# Patient Record
Sex: Female | Born: 1970 | Race: White | Hispanic: No | Marital: Married | Smoking: Former smoker
Health system: Southern US, Community
[De-identification: ages and names within clinical notes are randomized; demographics above are authoritative.]

## PROBLEM LIST (undated history)

## (undated) DIAGNOSIS — N841 Polyp of cervix uteri: Secondary | ICD-10-CM

## (undated) DIAGNOSIS — G43909 Migraine, unspecified, not intractable, without status migrainosus: Secondary | ICD-10-CM

## (undated) HISTORY — DX: Polyp of cervix uteri: N84.1

## (undated) HISTORY — PX: TONSILLECTOMY AND ADENOIDECTOMY: SUR1326

## (undated) HISTORY — DX: Migraine, unspecified, not intractable, without status migrainosus: G43.909

---

## 2005-02-03 ENCOUNTER — Inpatient Hospital Stay (HOSPITAL_COMMUNITY): Admission: AD | Admit: 2005-02-03 | Discharge: 2005-02-03 | Payer: Self-pay | Admitting: *Deleted

## 2005-03-21 ENCOUNTER — Other Ambulatory Visit: Admission: RE | Admit: 2005-03-21 | Discharge: 2005-03-21 | Payer: Self-pay | Admitting: Obstetrics and Gynecology

## 2005-05-04 ENCOUNTER — Ambulatory Visit (HOSPITAL_COMMUNITY): Admission: RE | Admit: 2005-05-04 | Discharge: 2005-05-04 | Payer: Self-pay | Admitting: Obstetrics and Gynecology

## 2005-05-07 ENCOUNTER — Ambulatory Visit (HOSPITAL_COMMUNITY): Admission: RE | Admit: 2005-05-07 | Discharge: 2005-05-07 | Payer: Self-pay | Admitting: Obstetrics and Gynecology

## 2005-05-07 ENCOUNTER — Encounter (INDEPENDENT_AMBULATORY_CARE_PROVIDER_SITE_OTHER): Payer: Self-pay | Admitting: *Deleted

## 2005-05-07 HISTORY — PX: LAPAROSCOPIC OVARIAN CYSTECTOMY: SHX6248

## 2006-03-03 DIAGNOSIS — N841 Polyp of cervix uteri: Secondary | ICD-10-CM

## 2006-03-03 HISTORY — DX: Polyp of cervix uteri: N84.1

## 2006-03-22 ENCOUNTER — Other Ambulatory Visit: Admission: RE | Admit: 2006-03-22 | Discharge: 2006-03-22 | Payer: Self-pay | Admitting: Obstetrics & Gynecology

## 2006-07-05 ENCOUNTER — Ambulatory Visit (HOSPITAL_COMMUNITY): Admission: RE | Admit: 2006-07-05 | Discharge: 2006-07-05 | Payer: Self-pay | Admitting: Obstetrics & Gynecology

## 2007-02-10 ENCOUNTER — Ambulatory Visit: Payer: Self-pay | Admitting: Family Medicine

## 2007-02-10 DIAGNOSIS — G43809 Other migraine, not intractable, without status migrainosus: Secondary | ICD-10-CM | POA: Insufficient documentation

## 2007-02-20 ENCOUNTER — Ambulatory Visit: Payer: Self-pay | Admitting: Family Medicine

## 2007-02-20 DIAGNOSIS — J069 Acute upper respiratory infection, unspecified: Secondary | ICD-10-CM | POA: Insufficient documentation

## 2007-04-21 ENCOUNTER — Other Ambulatory Visit: Admission: RE | Admit: 2007-04-21 | Discharge: 2007-04-21 | Payer: Self-pay | Admitting: Obstetrics & Gynecology

## 2007-04-24 ENCOUNTER — Encounter: Payer: Self-pay | Admitting: Family Medicine

## 2007-05-07 ENCOUNTER — Encounter: Payer: Self-pay | Admitting: Family Medicine

## 2008-04-20 ENCOUNTER — Ambulatory Visit: Payer: Self-pay | Admitting: Family Medicine

## 2008-04-22 ENCOUNTER — Other Ambulatory Visit: Admission: RE | Admit: 2008-04-22 | Discharge: 2008-04-22 | Payer: Self-pay | Admitting: Obstetrics & Gynecology

## 2008-06-11 ENCOUNTER — Encounter: Admission: RE | Admit: 2008-06-11 | Discharge: 2008-06-11 | Payer: Self-pay | Admitting: Orthopedic Surgery

## 2009-01-06 ENCOUNTER — Encounter: Admission: RE | Admit: 2009-01-06 | Discharge: 2009-01-06 | Payer: Self-pay | Admitting: Neurosurgery

## 2009-01-15 ENCOUNTER — Inpatient Hospital Stay (HOSPITAL_COMMUNITY): Admission: AD | Admit: 2009-01-15 | Discharge: 2009-01-15 | Payer: Self-pay | Admitting: Obstetrics & Gynecology

## 2009-02-11 ENCOUNTER — Encounter: Admission: RE | Admit: 2009-02-11 | Discharge: 2009-02-11 | Payer: Self-pay | Admitting: Neurosurgery

## 2009-02-14 ENCOUNTER — Encounter: Admission: RE | Admit: 2009-02-14 | Discharge: 2009-02-14 | Payer: Self-pay | Admitting: Neurosurgery

## 2009-03-07 ENCOUNTER — Encounter: Admission: RE | Admit: 2009-03-07 | Discharge: 2009-03-07 | Payer: Self-pay | Admitting: Neurosurgery

## 2009-10-24 ENCOUNTER — Inpatient Hospital Stay (HOSPITAL_COMMUNITY): Admission: AD | Admit: 2009-10-24 | Discharge: 2009-10-24 | Payer: Self-pay | Admitting: Obstetrics and Gynecology

## 2009-12-12 ENCOUNTER — Inpatient Hospital Stay (HOSPITAL_COMMUNITY): Admission: AD | Admit: 2009-12-12 | Discharge: 2009-12-15 | Payer: Self-pay | Admitting: Obstetrics & Gynecology

## 2010-08-24 ENCOUNTER — Ambulatory Visit: Payer: Self-pay | Admitting: Family Medicine

## 2010-08-29 ENCOUNTER — Telehealth (INDEPENDENT_AMBULATORY_CARE_PROVIDER_SITE_OTHER): Payer: Self-pay

## 2010-11-30 ENCOUNTER — Ambulatory Visit
Admission: RE | Admit: 2010-11-30 | Discharge: 2010-11-30 | Payer: Self-pay | Source: Home / Self Care | Admitting: Family Medicine

## 2010-11-30 DIAGNOSIS — R059 Cough, unspecified: Secondary | ICD-10-CM | POA: Insufficient documentation

## 2010-11-30 DIAGNOSIS — J209 Acute bronchitis, unspecified: Secondary | ICD-10-CM | POA: Insufficient documentation

## 2010-11-30 DIAGNOSIS — R05 Cough: Secondary | ICD-10-CM

## 2010-12-02 ENCOUNTER — Telehealth (INDEPENDENT_AMBULATORY_CARE_PROVIDER_SITE_OTHER): Payer: Self-pay | Admitting: *Deleted

## 2010-12-24 ENCOUNTER — Encounter: Payer: Self-pay | Admitting: Neurosurgery

## 2010-12-24 ENCOUNTER — Encounter: Payer: Self-pay | Admitting: Obstetrics & Gynecology

## 2011-01-02 NOTE — Progress Notes (Signed)
  Phone Note Outgoing Call   Call placed by: Areta Haber CMA,  August 29, 2010 8:01 PM Call placed to: Patient Summary of Call: Courtesy call message  left on cell voice mail. Initial call taken by: Areta Haber CMA,  August 29, 2010 8:02 PM

## 2011-01-02 NOTE — Assessment & Plan Note (Signed)
Summary: PAIN IN ELBOW AND ARM/TJ Rm 5   Vital Signs:  Patient Profile:   40 Years Old Female CC:      right elbow pain x 2 months, increasing Height:     71 inches Weight:      145 pounds O2 Sat:      100 % O2 treatment:    Room Air Temp:     98.5 degrees F oral Pulse rate:   75 / minute Resp:     14 per minute BP sitting:   119 / 78  (left arm) Cuff size:   regular  Pt. in pain?   yes    Location:   right elbow    Type:       aching  Vitals Entered By: Lajean Saver RN (August 24, 2010 2:12 PM)                   Updated Prior Medication List: MULTIVITAMINS  CAPS (MULTIPLE VITAMIN) take one capsule by mouth once a day  Current Allergies (reviewed today): BIAXINHistory of Present Illness Chief Complaint: right elbow pain x 2 months, increasing History of Present Illness:  Subjective:  Patient complains of persistent pain in her right elbow for about 2 months.  No recent injury but she does recall getting her right arm caught in her son's crib about 7 months ago.  She now has persistent pain in her lateral elbow, worse when lifting her 17 month old son.  REVIEW OF SYSTEMS Constitutional Symptoms      Denies fever, chills, night sweats, weight loss, weight gain, and fatigue.  Eyes       Denies change in vision, eye pain, eye discharge, glasses, contact lenses, and eye surgery. Ear/Nose/Throat/Mouth       Denies hearing loss/aids, change in hearing, ear pain, ear discharge, dizziness, frequent runny nose, frequent nose bleeds, sinus problems, sore throat, hoarseness, and tooth pain or bleeding.  Respiratory       Denies dry cough, productive cough, wheezing, shortness of breath, asthma, bronchitis, and emphysema/COPD.  Cardiovascular       Denies murmurs, chest pain, and tires easily with exhertion.    Gastrointestinal       Denies stomach pain, nausea/vomiting, diarrhea, constipation, blood in bowel movements, and indigestion. Genitourniary       Denies painful  urination, kidney stones, and loss of urinary control. Neurological       Denies paralysis, seizures, and fainting/blackouts. Musculoskeletal       Complains of joint pain and joint stiffness.      Denies muscle pain, decreased range of motion, redness, swelling, muscle weakness, and gout.      Comments: right elbow Skin       Denies bruising, unusual mles/lumps or sores, and hair/skin or nail changes.  Psych       Denies mood changes, temper/anger issues, anxiety/stress, speech problems, depression, and sleep problems. Other Comments: Patient c/o right elbow pain intermittent x 2 months, increasing in the past week   Past History:  Past Medical History: Reviewed history from 04/20/2008 and no changes required. LBP 2008 - [facility]  Past Surgical History: Reviewed history from 02/10/2007 and no changes required. GYN surgery-Ovarian cyst removed 6/06 Tonsillectomy 9/04  Family History: Reviewed history from 02/10/2007 and no changes required. 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   Current Smoker 1/2 PPD Alcohol use-no Regular exercise-yes Drug use-no Drug Use:  no   Objective:  Appearance:  Patient appears healthy, stated age, and in no acute distress  Right elbow:  Full range of motion.  No swelling or deformity.  Distinct tenderness over the lateral epicondyle, worse with resisted dorsiflexion of the wrist.  Distal neurovascular intact  RIGHT ELBOW - COMPLETE 3+ VIEW   Comparison: None.   Findings: No acute fracture is seen.  Alignment is normal.  No joint effusion is seen.   IMPRESSION: Negative right elbow.  Assessment New Problems: LATERAL EPICONDYLITIS, RIGHT (ICD-726.32) ELBOW PAIN, RIGHT (ICD-719.42)   Plan New Medications/Changes: TRAMADOL HCL 50 MG TABS (TRAMADOL HCL) One or two tabs by mouth hs as needed for pain  #15 x 0, 08/24/2010, Donna Christen MD NAPROXEN 500 MG TABS (NAPROXEN) One by mouth  two times a day pc  #20 x 1, 08/24/2010, Donna Christen MD  New Orders: T-DG Elbow Complete*R* [73080] New Patient Level III [99203] Tennis Elbow Support [L3701] Planning Comments:   Dispensed tennis elbow strap.  Begin ice packs and Naproxen.  Tramadol at bedtime.  Begin stretching exercises ([facility] information and instruction patient handout given)  If not improved  2 to 3 weeks, recommend evaluation by [facility].   The patient and/or caregiver has been counseled thoroughly with regard to medications prescribed including dosage, schedule, interactions, rationale for use, and possible side effects and they verbalize understanding.  Diagnoses and expected course of recovery discussed and will return if not improved as expected or if the condition worsens. Patient and/or caregiver verbalized understanding.  Prescriptions: TRAMADOL HCL 50 MG TABS (TRAMADOL HCL) One or two tabs by mouth hs as needed for pain  #15 x 0   Entered and Authorized by:   Donna Christen MD   Signed by:   Donna Christen MD on 08/24/2010   Method used:   Print then Give to Patient   RxID:   [id number] NAPROXEN 500 MG TABS (NAPROXEN) One by mouth two times a day pc  #20 x 1   Entered and Authorized by:   Donna Christen MD   Signed by:   Donna Christen MD on 08/24/2010   Method used:   Print then Give to Patient   RxID:   [id number]   Orders Added: 1)  T-DG Elbow Complete*R* [73080] 2)  New Patient Level III [84696] 3)  Tennis Elbow Support [L3701]

## 2011-01-04 NOTE — Assessment & Plan Note (Signed)
Summary: COUGH,CONGESTION,SORE THROAT/TJ Room 5   Vital Signs:  Patient Profile:   40 Years Old Female CC:      Cough, congestion, tired x 1 month got better came back 5 days ago Height:     71 inches Weight:      142 pounds O2 Sat:      99 % O2 treatment:    Room Air Temp:     98.2 degrees F oral Pulse rate:   112 / minute Pulse rhythm:   regular Resp:     16 per minute BP sitting:   106 / 76  (left arm) Cuff size:   regular  Vitals Entered By: Emilio Math (November 30, 2010 6:26 PM)                  Current Allergies (reviewed today): BIAXINHistory of Present Illness Chief Complaint: Cough, congestion, tired x 1 month got better came back 5 days ago History of Present Illness:  Subjective: Patient complains of URI symptoms that started one week ago with sinus congestion followed by a non-productive cough.  She also had a mild sore throat now resolved.  She states that she had a milder respiratory infection about one month ago that resolved after one week with Levaquin.   No pleuritic pain No wheezing + nasal congestion +  post-nasal drainage No sinus pain/pressure No itchy/red eyes No earache No hemoptysis + SOB with activity + fever/chills No nausea No vomiting No abdominal pain No diarrhea No skin rashes + fatigue No myalgias No headache Used OTC meds without relief   Current Meds MULTIVITAMINS  CAPS (MULTIPLE VITAMIN) take one capsule by mouth once a day IBUPROFEN 800 MG TABS (IBUPROFEN)  AZITHROMYCIN 250 MG TABS (AZITHROMYCIN) Two tabs by mouth on day 1, then 1 tab daily on days 2 through 5 BENZONATATE 200 MG CAPS (BENZONATATE) One by mouth hs as needed cough  REVIEW OF SYSTEMS Constitutional Symptoms       Complains of fatigue.     Denies fever, chills, night sweats, weight loss, and weight gain.  Eyes       Denies change in vision, eye pain, eye discharge, glasses, contact lenses, and eye surgery. Ear/Nose/Throat/Mouth       Complains of  frequent runny nose, sinus problems, and hoarseness.      Denies hearing loss/aids, change in hearing, ear pain, ear discharge, dizziness, frequent nose bleeds, sore throat, and tooth pain or bleeding.  Respiratory       Complains of dry cough.      Denies productive cough, wheezing, shortness of breath, asthma, bronchitis, and emphysema/COPD.  Cardiovascular       Denies murmurs, chest pain, and tires easily with exhertion.    Gastrointestinal       Denies stomach pain, nausea/vomiting, diarrhea, constipation, blood in bowel movements, and indigestion. Genitourniary       Denies painful urination, kidney stones, and loss of urinary control. Neurological       Complains of headaches.      Denies paralysis, seizures, and fainting/blackouts. Musculoskeletal       Denies muscle pain, joint pain, joint stiffness, decreased range of motion, redness, swelling, muscle weakness, and gout.  Skin       Denies bruising, unusual mles/lumps or sores, and hair/skin or nail changes.  Psych       Denies mood changes, temper/anger issues, anxiety/stress, speech problems, depression, and sleep problems.  Past History:  Past Medical History: Reviewed history from 04/20/2008 and  no changes required. LBP 2008 - [facility]  Past Surgical History: Reviewed history from 02/10/2007 and no changes required. GYN surgery-Ovarian cyst removed 6/06 Tonsillectomy 9/04  Family History: Reviewed history from 02/10/2007 and no changes required. Uncles with DM Mother, Healthy Father, Healthy  Social History: .  Assoc. degree. single.   Current Smoker 1/2 PPD, 16 yrs Alcohol use-no Regular exercise-yes Drug use-no   Objective:  Appearance:  Patient appears healthy, stated age, and in no acute distress  Eyes:  Pupils are equal, round, and reactive to light and accomdation.  Extraocular movement is intact.  Conjunctivae are not inflamed.  Ears:  Canals normal.  Tympanic membranes normal.     Nose:  Normal septum.  Normal turbinates, mildly congested.   No sinus tenderness present.  Pharynx:  Normal, moist mucous membranes  Neck:  Supple.  Slightly tender shotty posterior nodes are palpated bilaterally.  Lungs:   Bibasilar wheezes anterior and posterior.  Rhonchi posteriorly.  Few rales left base.  Breath sounds are equal. Heart:  Regular rate and rhythm without murmurs, rubs, or gallops.  Abdomen:  Nontender without masses or hepatosplenomegaly.  Bowel sounds are present.  No CVA or flank tenderness.  Extremities:  No edema. Skin:  No rash Chest X-ray: No evidence of acute cardiopulmonary disease.   Assessment New Problems: ACUTE BRONCHITIS (ICD-466.0) COUGH (ICD-786.2)  ? PERTUSSIS  Plan New Medications/Changes: BENZONATATE 200 MG CAPS (BENZONATATE) One by mouth hs as needed cough  #12 x 0, 11/30/2010, Donna Christen MD AZITHROMYCIN 250 MG TABS (AZITHROMYCIN) Two tabs by mouth on day 1, then 1 tab daily on days 2 through 5  #6 tabs x 0, 11/30/2010, Donna Christen MD  New Orders: T-Chest x-ray, 2 views [71020] Pulse Oximetry (single measurment) [94760] Est. Patient Level IV [16109] Planning Comments:   Begin Z-pack, expectorant/decongestant, cough suppressant at bedtime.  Increase fluid intake Followup with PCP if not improving 10 to 14 days. Recommend Tdap and flu shot when well.   The patient and/or caregiver has been counseled thoroughly with regard to medications prescribed including dosage, schedule, interactions, rationale for use, and possible side effects and they verbalize understanding.  Diagnoses and expected course of recovery discussed and will return if not improved as expected or if the condition worsens. Patient and/or caregiver verbalized understanding.  Prescriptions: BENZONATATE 200 MG CAPS (BENZONATATE) One by mouth hs as needed cough  #12 x 0   Entered and Authorized by:   Donna Christen MD   Signed by:   Donna Christen MD on 11/30/2010   Method  used:   Print then Give to Patient   RxID:   [id number] AZITHROMYCIN 250 MG TABS (AZITHROMYCIN) Two tabs by mouth on day 1, then 1 tab daily on days 2 through 5  #6 tabs x 0   Entered and Authorized by:   Donna Christen MD   Signed by:   Donna Christen MD on 11/30/2010   Method used:   Print then Give to Patient   RxID:   [id number]   Patient Instructions: 1)  May use Mucinex D (guaifenesin with decongestant) or plain Mucinex twice daily for cough and congestion. 2)  Increase fluid intake, rest. 3)  May use Afrin nasal spray (or generic oxymetazoline) twice daily for about 5 days.  Also recommend using saline nasal spray several times daily and/or saline nasal irrigation. 4)  Followup with family doctor if not improving 10 to 14 days.  Orders Added: 1)  T-Chest x-ray, 2 views [71020]  2)  Pulse Oximetry (single measurment) [94760] 3)  Est. Patient Level IV [16109]

## 2011-01-04 NOTE — Progress Notes (Signed)
  Phone Note Outgoing Call   Call placed by: Clemens Catholic LPN,  December 02, 2010 5:37 PM Call placed to: Patient Summary of Call: call back: called to F/U with pt. left message to call back if any questions or concerns. Initial call taken by: Clemens Catholic LPN,  December 02, 2010 5:38 PM

## 2011-01-29 ENCOUNTER — Other Ambulatory Visit: Payer: Self-pay | Admitting: Nurse Practitioner

## 2011-01-29 DIAGNOSIS — N83201 Unspecified ovarian cyst, right side: Secondary | ICD-10-CM

## 2011-01-30 ENCOUNTER — Ambulatory Visit (HOSPITAL_COMMUNITY)
Admission: RE | Admit: 2011-01-30 | Discharge: 2011-01-30 | Disposition: A | Discharge status: Home / Self Care | Payer: Managed Care, Other (non HMO) | Source: Ambulatory Visit | Attending: Nurse Practitioner | Admitting: Nurse Practitioner

## 2011-01-30 DIAGNOSIS — N83209 Unspecified ovarian cyst, unspecified side: Secondary | ICD-10-CM | POA: Insufficient documentation

## 2011-01-30 DIAGNOSIS — N83201 Unspecified ovarian cyst, right side: Secondary | ICD-10-CM

## 2011-01-30 DIAGNOSIS — N949 Unspecified condition associated with female genital organs and menstrual cycle: Secondary | ICD-10-CM | POA: Insufficient documentation

## 2011-02-18 LAB — CBC
Hemoglobin: 13.1 g/dL (ref 12.0–15.0)
MCV: 96.7 fL (ref 78.0–100.0)
Platelets: 146 10*3/uL — ABNORMAL LOW (ref 150–400)
Platelets: 203 10*3/uL (ref 150–400)
RBC: 2.61 MIL/uL — ABNORMAL LOW (ref 3.87–5.11)
RDW: 13.1 % (ref 11.5–15.5)
RDW: 13.5 % (ref 11.5–15.5)

## 2011-03-07 LAB — URINALYSIS, ROUTINE W REFLEX MICROSCOPIC
Glucose, UA: NEGATIVE mg/dL
Protein, ur: NEGATIVE mg/dL

## 2011-03-20 LAB — COMPREHENSIVE METABOLIC PANEL
AST: 21 U/L (ref 0–37)
Albumin: 4 g/dL (ref 3.5–5.2)
Alkaline Phosphatase: 54 U/L (ref 39–117)
BUN: 4 mg/dL — ABNORMAL LOW (ref 6–23)
CO2: 28 mEq/L (ref 19–32)
Chloride: 107 mEq/L (ref 96–112)
Creatinine, Ser: 0.66 mg/dL (ref 0.4–1.2)
GFR calc non Af Amer: >60 mL/min (ref >60)
Potassium: 3.3 mEq/L — ABNORMAL LOW (ref 3.5–5.1)
Total Bilirubin: 0.5 mg/dL (ref 0.3–1.2)

## 2011-03-20 LAB — URINALYSIS, ROUTINE W REFLEX MICROSCOPIC
Bilirubin Urine: NEGATIVE
Hgb urine dipstick: NEGATIVE
Nitrite: NEGATIVE
Protein, ur: NEGATIVE mg/dL
Specific Gravity, Urine: 1.02 (ref 1.005–1.030)
Urobilinogen, UA: 0.2 mg/dL (ref 0.0–1.0)

## 2011-03-20 LAB — AMYLASE: Amylase: 52 U/L (ref 27–131)

## 2011-03-20 LAB — POCT PREGNANCY, URINE: Preg Test, Ur: NEGATIVE

## 2011-03-20 LAB — CBC
Hemoglobin: 13.9 g/dL (ref 12.0–15.0)
MCHC: 34.2 g/dL (ref 30.0–36.0)

## 2011-04-03 HISTORY — PX: DIAGNOSTIC LAPAROSCOPY: SUR761

## 2011-04-06 ENCOUNTER — Other Ambulatory Visit: Payer: Self-pay | Admitting: Obstetrics & Gynecology

## 2011-04-06 ENCOUNTER — Encounter (HOSPITAL_COMMUNITY): Payer: Managed Care, Other (non HMO)

## 2011-04-06 LAB — CBC
MCH: 30.8 pg (ref 26.0–34.0)
MCV: 89.8 fL (ref 78.0–100.0)
Platelets: 226 10*3/uL (ref 150–400)
RBC: 4.71 MIL/uL (ref 3.87–5.11)
RDW: 12.2 % (ref 11.5–15.5)

## 2011-04-06 LAB — SURGICAL PCR SCREEN: Staphylococcus aureus: NEGATIVE

## 2011-04-16 ENCOUNTER — Ambulatory Visit (HOSPITAL_COMMUNITY)
Admission: RE | Admit: 2011-04-16 | Discharge: 2011-04-16 | Disposition: A | Discharge status: Home / Self Care | Payer: Managed Care, Other (non HMO) | Source: Ambulatory Visit | Attending: Obstetrics & Gynecology | Admitting: Obstetrics & Gynecology

## 2011-04-16 ENCOUNTER — Other Ambulatory Visit: Payer: Self-pay | Admitting: Obstetrics & Gynecology

## 2011-04-16 DIAGNOSIS — Z01812 Encounter for preprocedural laboratory examination: Secondary | ICD-10-CM | POA: Insufficient documentation

## 2011-04-16 DIAGNOSIS — N801 Endometriosis of ovary: Secondary | ICD-10-CM | POA: Insufficient documentation

## 2011-04-16 DIAGNOSIS — N80209 Endometriosis of unspecified fallopian tube, unspecified depth: Secondary | ICD-10-CM | POA: Insufficient documentation

## 2011-04-16 DIAGNOSIS — Z01818 Encounter for other preprocedural examination: Secondary | ICD-10-CM | POA: Insufficient documentation

## 2011-04-16 DIAGNOSIS — R1031 Right lower quadrant pain: Secondary | ICD-10-CM | POA: Insufficient documentation

## 2011-04-16 DIAGNOSIS — N80109 Endometriosis of ovary, unspecified side, unspecified depth: Secondary | ICD-10-CM | POA: Insufficient documentation

## 2011-04-16 DIAGNOSIS — N802 Endometriosis of fallopian tube: Secondary | ICD-10-CM | POA: Insufficient documentation

## 2011-04-20 NOTE — Op Note (Signed)
NAME:  [name] [name], [name] [name]             ACCOUNT NO.:  [id number]   MEDICAL RECORD NO.:  [id number]          PATIENT TYPE:  AMB   LOCATION:  SDC                           FACILITY:  [facility]   PHYSICIAN:  Cynthia P. Romine, M.D.DATE OF BIRTH:  25-Jul-1971   DATE OF PROCEDURE:  05/07/2005  DATE OF DISCHARGE:                                 OPERATIVE REPORT   PREOPERATIVE DIAGNOSIS:  8 cm clear right ovarian cyst.   POSTOPERATIVE DIAGNOSIS:  8 cm clear right ovarian cyst.  Pathology pending.   PROCEDURE:  Laparoscopic right ovarian cystectomy.   SURGEON:  Cynthia P. Romine, M.D.   ASSISTANT:  Gretta Cool, M.D.   ANESTHESIA:  General endotracheal.   ESTIMATED BLOOD LOSS:  Minimal.   COMPLICATIONS:  None.   DESCRIPTION OF PROCEDURE:  The patient was taken to the operating room and  after the induction of adequate general endotracheal anesthesia was placed  in the dorsal lithotomy position and prepped and draped in the usual sterile  fashion.  A Hulka uterine manipulator was placed.  The bladder was drained  with a red rubber catheter.  Attention was next turned to the umbilicus.  A  small vertical infraumbilical incision was made and a Veress needle was  inserted into the peritoneal space.  Proper placement was tested by noting  negative aspirate through the Veress needle with free flow of saline, again  with a negative aspirate, and then by noting the response of a drop of  saline placed at the hub of the Veress needle to negative pressure as the  abdominal wall was elevated.  Pneumoperitoneum was created with the  automatic insufflator to a pressure of 3 liters of CO2.  Disposable 10/11 mm  trocar was then inserted into the peritoneal space and proper placement  noted with the laparoscope.  Next, two suprapubic incisions were made to the  right and left of the midline and 5 mm trocars were inserted under direct  visualization.  The pelvis was inspected.  The uterus and tubes  appeared  normal.  The anterior and posterior cul-de-sacs were normal.  The left ovary  was also normal.  The right ovary had fallen into posterior cul-de-sac and  was freely mobile, but when elevated contained a smooth, approximately 8 cm  cyst.  Photographic documentation was taken.  Through one of the 5 mm ports  a needle aspirator was inserted into the apex of the ovarian cyst and 120 mL  of turbid fluid was removed from the cyst to decompress it.  The apex of the  cyst wall was then grasped with pickups and opened with scissors. And the  lining of the cyst was grasped with a pickup and sharp and blunt dissection  was used to tease the cyst wall away from the overlying ovary.  Bipolar  cautery was used to control any small vessels that looked like they might  bleed.  The hook scissors were used to trim the cyst wall away from the base  of the ovary where necessary and the cyst wall was freed off of the ovary.  It was  then pulled out through the 10 mm umbilical port.  The ovary was  inspected.  A Nezhat was used to irrigate the pelvis.  There was no bleeding  noted from the bed of the right ovary where the cyst wall had been removed  and in fact the opening in the ovary closed over nicely into an anatomic  position.  The pelvis was irrigated.  Again the ovary was inspected and felt  to be hemostatic.  The instruments were removed from the abdomen.  The  pneumoperitoneum was allowed to escape before the abdominal port was  removed.  The incisions were closed subcuticularly with 4-0 Vicryl.  The  instruments were removed from the vagina and the procedure was terminated.  The patient tolerated it well and went in satisfactory condition to  postanesthesia recovery.      CPR/MEDQ  D:  05/07/2005  T:  05/07/2005  Job:  [id number]   cc:   Gretta Cool, M.D.  [address] [place]  [place] [address]  Fax: [phone]

## 2011-04-24 NOTE — Op Note (Signed)
[name], [name]            ACCOUNT NO.:  [id number]  MEDICAL RECORD NO.:  [id number]           PATIENT TYPE:  LOCATION:                                 FACILITY:  PHYSICIAN:  M. Leda Quail, MD  DATE OF BIRTH:  28-May-1971  DATE OF PROCEDURE: DATE OF DISCHARGE:                              OPERATIVE REPORT   PREOPERATIVE DIAGNOSES: 32. 40 year old G2, P1, A1 married white female with chronic right     lower quadrant pain. 2. 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 3. Smoker.  POSTOPERATIVE DIAGNOSES: 63. 40 year old G2, P1, A1 married white female with chronic right     lower quadrant pain. 2. 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 3. Smoker. 4. Possible endometriosis on the right tube and right ovary.  PROCEDURE:  Diagnostic laparoscopy with biopsy of left fallopian tube and cautery of possible endometriosis on right ovary and right fallopian tube.  SURGEON:  M. Leda Quail, MD  ASSISTANT:  Edwena Felty. Romine, MD  ANESTHESIA:  General endotracheal, Dr. Rodman Pickle oversaw the case.  FINDINGS:  Small purplish area on the right ovary, a cyst of Morgagni on the right fallopian tube, a small beefy area of tissue on the right fallopian tube, 2 areas on the ovary and tube were consistent with possible endometriosis.  SPECIMENS:  Right tubal tissue biopsy sent to Pathology.  ESTIMATED BLOOD LOSS:  Minimal.  FLUIDS:  1000 mL of LR.  URINE OUTPUT:  300 mL of clear urine in the Foley catheter.  COMPLICATIONS:  None.  INDICATIONS:  [name] [name] is a very nice 40 year old G2, P27, A2 married white female who has had approximately 4 months of intermittent right lower quadrant pain.  She has sought evaluation in the ER at least twice because of this and has had ultrasounds as well as variety of blood work and CT scan done.  All of her evaluation thus far has been  negative. She came to see me in the beginning of April.  Because of the amount of evaluation that she has had, we talked about possible etiology of right lower quadrant pain.  It does have an intermittent cyclical nature to it and does somewhat seem to be related to her menstrual cycles.  She has had some small cysts noted on ultrasound and CT, but these have been insignificant looking and seem unlikely to be the source of her pain in my opinion.  Because of this, I have recommended we evaluate for endometriosis, possible interstitial cystitis, and possible adhesions of the right fallopian tube.  Other possible causes of the pelvic pain have been discussed with her as well.  We have discussed evaluation steps and I did discuss with her diagnostic laparoscopy for evaluation of endometriosis with treatment as well as for adhesive disease.  Risks and benefits have been explained to the patient.  This is all documented in her office chart.  She has consented and presents today for the procedure.  PROCEDURE:  The patient was taken to the operating room.  She was  placed in supine position.  General endotracheal anesthesia was administered by the anesthesia staff without difficulty.  Legs were placed in the low lithotomy position in [place] stirrups.  Running IV was in her left hand which was placed on the arm boards.  The right arm was tucked.  Once she was positioned, SCDs were on her lower extremities bilaterally.  The patient was prepped at the skin level with ChloraPrep and the vaginal mucosa and perineum with a Betadine.  After 3 minutes when the ChloraPrep was dried, the patient was draped in a normal standard fashion.  Legs were lifted to the high lithotomy position.  A bivalve speculum was placed in the vagina.  The anterior lip of the cervix was grasped with single-tooth tenaculum.  A Hulka clamp was passed through the cervical canal and attached to the cervix as the means to  manipulate the uterus in the procedure.  The tenaculum was removed.  The bivalve speculum was removed.  A Foley catheter was inserted in the bladder to straight drain.  Legs were positioned in the low lithotomy position. Attention was turned to the abdomen.  Marcaine 0.25% was used to anesthetize the umbilicus.  She has had a previous laparoscopy and the same incision was used for the surgery.  Using a #11 blade, the incision was reentered and re-incised.  Subcutaneous fat and tissue was dissected with hemostats.  The abdomen was elevated.  Veress needle was passed through the abdominal wall layers without difficulty.  A syringe of normal saline was obtained and aspiration was performed without any blood or fluid being noted.  Fluid passed easily into the syringe and another aspiration was performed.  No blood, fluid, or saline was brought back into the tube.  Fluid dripped easily into the tube and then pneumoperitoneum was achieved under low flow CO2 gas.  Once 2-1/2 liters from of the abdomen, the Veress needle was removed.  A 10-mm laparoscope with the OptiVu non-bladed trocar and port were obtained.  With a twisting motion, these were passed through the abdominal wall layers. The abdominal wall layers were visualized as they were traversed.  Once the device was passed into the pelvic cavity and passed through the peritoneum, the trocar was removed.  Then, the laparoscope was used to visualize the pelvis.  The CO2 gas was placed on the high flow.  The patient was placed in some Trendelenburg and with manipulation of the uterus through the Hulka clamp, the anterior and posterior cul-de-sacs, uterosacral ligaments, right and left pelvic sidewalls, upper abdomen including stomach, falciform ligament, liver edge, gallbladder, and appendix were all noted and noted to be normal.  Because I wanted to see the front and the back side of the ovary and the peritoneum behind the ovaries, the  abdominal wall layer on the right was transilluminated. Marcaine 0.25% was used to anesthetize the skin.  An #11 blade was used to make a 5-mm skin incision.  A non-bladed trocar and port were placed through the right lower quadrant out difficulty.  Then, a grasper was used to lift the ovaries bilaterally.  The left ovary and tube were completely normal.  The right tube had a small cyst of Morgagni on it. Then, there was a small purplish area noted on the right ovary and a small beefy area of tissue noted on the right fallopian tube.  These were possible areas of endometriosis.  Behind the ovary, it was completely normal.  There was no adhesions noted.  Of note, the  ovary was sitting in old pocket of tissue that was created by the ureter on the right, but the ovary was freely mobile, so I was not sure that this was anything other than just finding at the time of surgery.  A biopsy forceps was used to biopsy the beefy tissue of the fallopian tube. Monopolar cautery was then obtained.  The patient was grounded.  Using endoscopic scissors, the area on the fallopian tube was cauterized superficially.  Normal saline was used to irrigate the pelvis at this point, no bleeding was noted.  The procedure was ended.  The instruments were removed under direct visualization of the laparoscope and the right lower quadrant port was removed under direct visualization of laparoscope.  Then, the pneumoperitoneum was released.  The port was removed from the midline after the CRNA gave the patient several deep breaths and attempted again to get any CO2 gas out of the abdomen.  The fascia was closed at the umbilicus with figure-of-eight suture of 0 Vicryl.  Skin was then closed with subcuticular stitch of 4-0 Vicryl. Skin was cleansed.  Dermabond was applied.  The instruments from the vagina were removed and there was no bleeding noted from the cervix.  The patient was taken out of low lithotomy position to  the supine position. She was awakened from anesthesia and extubated without difficulty. Sponge, lap, needle, and instrument counts were correct x2.  Pathology specimen was the right fallopian tube biopsy.     Lum Keas, MD     MSM/MEDQ  D:  04/16/2011  T:  04/16/2011  Job:  [id number]  Electronically Signed by Paul Half MD on 04/24/2011 10:00:12 AM

## 2013-05-11 ENCOUNTER — Encounter: Payer: Self-pay | Admitting: Obstetrics & Gynecology

## 2013-05-11 ENCOUNTER — Telehealth: Payer: Self-pay | Admitting: *Deleted

## 2013-05-11 NOTE — Telephone Encounter (Signed)
Need to move appointment later in day due to unscheduled surgery conflict. LMTCB.

## 2013-05-11 NOTE — Telephone Encounter (Signed)
Appointment moved down to 4pm on same day due to surgery schedule.

## 2013-05-12 ENCOUNTER — Ambulatory Visit (INDEPENDENT_AMBULATORY_CARE_PROVIDER_SITE_OTHER): Payer: Managed Care, Other (non HMO) | Admitting: Obstetrics & Gynecology

## 2013-05-12 ENCOUNTER — Encounter: Payer: Self-pay | Admitting: Obstetrics & Gynecology

## 2013-05-12 ENCOUNTER — Ambulatory Visit: Payer: Self-pay | Admitting: Obstetrics & Gynecology

## 2013-05-12 VITALS — BP 100/60 | HR 92 | Resp 16 | Ht 69.75 in | Wt 139.8 lb

## 2013-05-12 DIAGNOSIS — Z01419 Encounter for gynecological examination (general) (routine) without abnormal findings: Secondary | ICD-10-CM

## 2013-05-12 DIAGNOSIS — Z0000 Encounter for general adult medical examination without abnormal findings: Secondary | ICD-10-CM

## 2013-05-12 LAB — POCT URINALYSIS DIPSTICK
Bilirubin, UA: NEGATIVE
Blood, UA: NEGATIVE
Ketones, UA: NEGATIVE
Protein, UA: NEGATIVE
pH, UA: 5

## 2013-05-12 MED ORDER — TRAMADOL HCL 50 MG PO TABS: 50.0000 mg | ORAL_TABLET | Freq: Four times a day (QID) | ORAL | Status: DC | PRN

## 2013-05-12 NOTE — Patient Instructions (Signed)

## 2013-05-12 NOTE — Progress Notes (Signed)
42 y.o. G2P1 MarriedCaucasianF here for annual exam.  Cycles are regular.  Still having pain midcycle.  Does have cramping.  No change really over the last few years.  She has undergone an evaluation for back pain to see if this was contributing.  Some months do hurt more than others.    D/W pt starting MMG.    Patient's last menstrual period was 04/26/2013.          Sexually active: yes  The current method of family planning is condoms most of the time.    Exercising: no  not regularly Smoker:  yes  Health Maintenance: Pap:  04/10/12 WNL (ASCUS/negative HR HPV 2012) History of abnormal Pap:  no MMG:  none Colonoscopy:  none BMD:   none TDaP:  04/10/12 Screening Labs: none today, Hb today: 14.3, Urine today: negative   reports that she has been smoking.  She has never used smokeless tobacco. She reports that she does not drink alcohol or use illicit drugs.  Past Medical History  Diagnosis Date  . Migraine     without aura  . Endocervical polyp 4/07    benign    Past Surgical History  Procedure Laterality Date  . Tonsillectomy and adenoidectomy    . Laparoscopic ovarian cystectomy  05/07/05    right (7cm)/secondary to mucinous cystadenoma  . Diagnostic laparoscopy  5/12    secondary to pain/negative    Current Outpatient Prescriptions  Medication Sig Dispense Refill  . Calcium-Magnesium-Vitamin D (CALCIUM 500 PO) Take by mouth daily.      . cetirizine (ZYRTEC) 10 MG tablet Take 10 mg by mouth daily.      Marland Kitchen ibuprofen (ADVIL,MOTRIN) 200 MG tablet Take 200 mg by mouth every 6 (six) hours as needed for pain.       No current facility-administered medications for this visit.    Family History  Problem Relation Age of Onset  . Hypertension Father   . Thyroid disease Mother     thyroidectomy  . Osteoporosis Mother   . Osteoporosis Maternal Grandmother   . Breast cancer Paternal Aunt 42  . Ovarian cancer Paternal Aunt     ROS:  Pertinent items are noted in HPI.  Otherwise,  a comprehensive ROS was negative.  Exam:   BP 100/60  Pulse 92  Resp 16  Ht 5' 9.75" (1.772 m)  Wt 139 lb 12.8 oz (63.413 kg)  BMI 20.2 kg/m2  LMP 04/26/2013  Weight change: +3lbs  Height: 5' 9.75" (177.2 cm)  Ht Readings from Last 3 Encounters:  05/12/13 5' 9.75" (1.772 m)  02/10/07 5\' 11"  (1.803 m)    General appearance: alert, cooperative and appears stated age Head: Normocephalic, without obvious abnormality, atraumatic Neck: no adenopathy, supple, symmetrical, trachea midline and thyroid normal to inspection and palpation Lungs: clear to auscultation bilaterally Breasts: normal appearance, no masses or tenderness Heart: regular rate and rhythm Abdomen: soft, non-tender; bowel sounds normal; no masses,  no organomegaly Extremities: extremities normal, atraumatic, no cyanosis or edema Skin: Skin color, texture, turgor normal. No rashes or lesions Lymph nodes: Cervical, supraclavicular, and axillary nodes normal. No abnormal inguinal nodes palpated Neurologic: Grossly normal   Pelvic: External genitalia:  no lesions              Urethra:  normal appearing urethra with no masses, tenderness or lesions              Bartholins and Skenes: normal  Vagina: normal appearing vagina with normal color and discharge, no lesions              Cervix: no lesions              Pap taken: no Bimanual Exam:  Uterus:  normal size, contour, position, consistency, mobility, non-tender              Adnexa: normal adnexa and no mass, fullness, tenderness               Rectovaginal: Confirms               Anus:  normal sphincter tone, no lesions  A:  Well Woman with normal exam H/O RLQ pain, mid cycle, negative laparoscopy Migraines  P:   Mammogram recommended.  D/W pt 3D MMG. Plan fasting labs next year pap smear nl last year.  Neg HR HPV 2012. Tramadol rx to pharmacy return annually or prn  An After Visit Summary was printed and given to the patient.

## 2013-05-13 LAB — COMPREHENSIVE METABOLIC PANEL
AST: 16 U/L (ref 0–37)
Albumin: 4.7 g/dL (ref 3.5–5.2)
BUN: 8 mg/dL (ref 6–23)
CO2: 28 mEq/L (ref 19–32)
Calcium: 10.1 mg/dL (ref 8.4–10.5)
Chloride: 105 mEq/L (ref 96–112)
Creat: 0.74 mg/dL (ref 0.50–1.10)
Glucose, Bld: 99 mg/dL (ref 70–99)
Potassium: 3.9 mEq/L (ref 3.5–5.3)

## 2013-05-13 LAB — LIPID PANEL
HDL: 43 mg/dL (ref >39)
LDL Cholesterol: 111 mg/dL — ABNORMAL HIGH (ref 0–99)
Triglycerides: 89 mg/dL (ref <150)

## 2013-05-13 LAB — TSH: TSH: 1.282 u[IU]/mL (ref 0.350–4.500)

## 2013-05-18 ENCOUNTER — Telehealth: Payer: Self-pay

## 2013-05-18 NOTE — Telephone Encounter (Signed)
6/16 lmtcb//kn 

## 2013-05-18 NOTE — Telephone Encounter (Signed)
Returning call.

## 2013-06-01 NOTE — Telephone Encounter (Signed)
6/30 lmtcb//kn 

## 2013-06-08 NOTE — Telephone Encounter (Signed)
Patient notified of all results. 

## 2013-09-04 ENCOUNTER — Telehealth: Payer: Self-pay | Admitting: Obstetrics & Gynecology

## 2013-09-04 NOTE — Telephone Encounter (Signed)
[facility] [place] [facility] requesting refill on tramadol for patient. Their number is [phone].

## 2013-09-04 NOTE — Telephone Encounter (Signed)
Please advise #30/4 rf's was sent through [facility] 05/12/13

## 2013-09-09 MED ORDER — TRAMADOL HCL 50 MG PO TABS: 50.0000 mg | ORAL_TABLET | Freq: Four times a day (QID) | ORAL | Status: DC | PRN

## 2013-09-09 NOTE — Telephone Encounter (Signed)
Actually this needs to be called in.  #30/0 refills.  50mg  1-2 every 6 hrs prn.

## 2013-09-09 NOTE — Telephone Encounter (Signed)
Dr. Hyacinth Meeker  Have you had a chance to look at this refill request.

## 2013-09-09 NOTE — Telephone Encounter (Signed)
Please call pt and assess headaches.  She is using Tramadol for them and has used #30/4 refills since June.  That is a LOT.  One refill sent to pharmacy but if she is using that much, needs headache assessment.  I can place referral order.

## 2013-09-10 NOTE — Telephone Encounter (Signed)
Spoke with patient about refills for Tramadol #30 w/4 refills since  05/2013 to assess headaches, patient says She's using the Tramadol for Ovulation pain and if she's having a headache at that time then this Is a bonus. I explained to her that Dr. Hyacinth Meeker was concerned if you were taking this for headaches she would Need a referral for headache assessment. Pt assured me that it was not headaches mainly Ovulation pain. Is this ok?

## 2013-09-10 NOTE — Telephone Encounter (Signed)
[phone]) for Tramadol 50mg  #30 w/ 0refills 1-2 every 6hrs prn. Pt was notified.

## 2013-11-10 ENCOUNTER — Other Ambulatory Visit: Payer: Self-pay | Admitting: Obstetrics & Gynecology

## 2013-11-10 NOTE — Telephone Encounter (Signed)
Pt.asking for refill on Tramadol 50mg , Rx given 09/09/13 #30 x 0 refills. Please advise

## 2013-11-11 NOTE — Telephone Encounter (Signed)
Rx faxed to [facility]  Tramadol 50 mg #30 x 1 refill.

## 2014-01-21 ENCOUNTER — Other Ambulatory Visit: Payer: Self-pay | Admitting: Obstetrics & Gynecology

## 2014-01-21 NOTE — Telephone Encounter (Signed)
Last AEX 05/12/2013 Last refill 11/10/2013 #30/1 refill No future appt's.   Please advise.

## 2014-02-26 ENCOUNTER — Telehealth: Payer: Self-pay | Admitting: Obstetrics & Gynecology

## 2014-02-26 NOTE — Telephone Encounter (Signed)
Pt says she is having pelvic pain and some bloating. Would like an appointment with Dr Hyacinth MeekerMiller.

## 2014-02-26 NOTE — Telephone Encounter (Signed)
Spoke with patient. Patient states that she is having pelvic pain that has been going on for four years and that Dr.Miller is aware. Pain has gotten worse in the past six months. Feeling a sense of fullness and bloating with constipation over the past two weeks. States "I have had this pain for a while and I have had it checked and nothing has been wrong. I just want to come in to make sure everything is still okay." Patient requesting office visit with Dr.Miller for next week. Appointment made for Monday 3/30 at 8:30. Patient verbalizes understanding and is agreeable to time and date. Will call back with any further questions or concerns.  Routing to provider for final review. Patient agreeable to disposition. Will close encounter

## 2014-03-01 ENCOUNTER — Ambulatory Visit (INDEPENDENT_AMBULATORY_CARE_PROVIDER_SITE_OTHER): Payer: Managed Care, Other (non HMO) | Admitting: Obstetrics & Gynecology

## 2014-03-01 VITALS — BP 108/68 | HR 64 | Resp 16 | Ht 69.75 in | Wt 138.6 lb

## 2014-03-01 DIAGNOSIS — K5900 Constipation, unspecified: Secondary | ICD-10-CM

## 2014-03-01 DIAGNOSIS — K5909 Other constipation: Secondary | ICD-10-CM

## 2014-03-01 DIAGNOSIS — R1011 Right upper quadrant pain: Secondary | ICD-10-CM

## 2014-03-01 DIAGNOSIS — N9489 Other specified conditions associated with female genital organs and menstrual cycle: Secondary | ICD-10-CM

## 2014-03-01 LAB — HEPATIC FUNCTION PANEL
ALBUMIN: 4.6 g/dL (ref 3.5–5.2)
ALT: 15 U/L (ref 0–35)
AST: 17 U/L (ref 0–37)
Alkaline Phosphatase: 41 U/L (ref 39–117)
BILIRUBIN TOTAL: 0.5 mg/dL (ref 0.2–1.2)
Bilirubin, Direct: 0.1 mg/dL (ref 0.0–0.3)
Indirect Bilirubin: 0.4 mg/dL (ref 0.2–1.2)
TOTAL PROTEIN: 6.9 g/dL (ref 6.0–8.3)

## 2014-03-01 MED ORDER — TRAMADOL HCL 50 MG PO TABS: ORAL_TABLET | ORAL | Status: DC

## 2014-03-01 MED ORDER — FLUOXETINE HCL 10 MG PO TABS: 10.0000 mg | ORAL_TABLET | Freq: Every day | ORAL | Status: DC

## 2014-03-01 NOTE — Progress Notes (Signed)
[facility] [address] 03/04/14 at 0830. Agreeable to time/date/location.

## 2014-03-01 NOTE — Progress Notes (Signed)
Subjective:     Patient ID: [name] [name], female   DOB: [birth year], 43 y.o.   MRN: [id number]  HPI 43 yo G2P1 here for discussion of pelvic pain and bloating.  Feels like she has a "water balloon" in her pelvis that she seems to feel more when she moves, sits, exercises.  This seems to have worsened over the last year.  She has noticed cyclic constipation that is related to her cycle.  This occurs mid cycle, right around ovulation for her patient.  Can go one week without a bowel movement.  Using laxatives for this.   Cycles are regular.  Patient still contemplating having another child but not actively trying.  New issue with RUQ pain after eating.  Having some indigestion as well.  Drank apple cider vinegar and this did help.  She does have some anxiety about cardiac issues.    Review of Systems  All other systems reviewed and are negative.       Objective:   Physical Exam  Constitutional: She is oriented to person, place, and time. She appears well-developed and well-nourished.  Cardiovascular: Normal rate and regular rhythm.   Pulmonary/Chest: Effort normal and breath sounds normal.  Abdominal: Soft. Bowel sounds are normal. She exhibits no distension and no mass. There is no tenderness. There is no rebound and no guarding.  Genitourinary: Uterus normal. There is no rash, tenderness or lesion on the right labia. There is no rash, tenderness or lesion on the left labia. Cervix exhibits no motion tenderness, no discharge and no friability. Right adnexum displays mass (4cm fullness noted) and fullness. Right adnexum displays no tenderness. Left adnexum displays no mass, no tenderness and no fullness. No erythema or bleeding around the vagina. No foreign body around the vagina. No signs of injury around the vagina. No vaginal discharge found.  Lymphadenopathy:       Right: No inguinal adenopathy present.       Left: No inguinal adenopathy present.  Neurological: She is alert and oriented  to person, place, and time.  Skin: Skin is warm and dry.  Psychiatric: She has a normal mood and affect.       Assessment:     RUQ pain Right adnexal mass Chronic constipation     Plan:     Hepatic panel RUQ u/s and pelvic u/s Referral to Dr. Loreta AveMann Consider cyclic Prozac 10mg  for 14 days starting after menses Tramadol 50mg  1-2 tabs every 4-6 hr prn #30/1RF

## 2014-03-02 ENCOUNTER — Encounter: Payer: Self-pay | Admitting: Obstetrics & Gynecology

## 2014-03-04 ENCOUNTER — Ambulatory Visit
Admission: RE | Admit: 2014-03-04 | Discharge: 2014-03-04 | Disposition: A | Discharge status: Home / Self Care | Payer: Managed Care, Other (non HMO) | Source: Ambulatory Visit | Attending: Obstetrics & Gynecology | Admitting: Obstetrics & Gynecology

## 2014-03-04 ENCOUNTER — Telehealth: Payer: Self-pay | Admitting: Obstetrics & Gynecology

## 2014-03-04 DIAGNOSIS — N83201 Unspecified ovarian cyst, right side: Secondary | ICD-10-CM

## 2014-03-04 DIAGNOSIS — K5909 Other constipation: Secondary | ICD-10-CM

## 2014-03-04 DIAGNOSIS — R1011 Right upper quadrant pain: Secondary | ICD-10-CM

## 2014-03-04 NOTE — Telephone Encounter (Signed)
Patient is asking if we have received her ultrasound results. Patient says she had the ultrasound done this morning.

## 2014-03-04 NOTE — Telephone Encounter (Signed)
Spoke with patient and message from Dr. Hyacinth MeekerMiller given. PUS results normal, has 5 mm R Copus Luteum cyst that can occur with ovulation.  Recommend follow up in two months. PUS scheduled for 05/13/14. Patient will follow up with Dr. Loreta AveMann. Referral in.   Routing to provider for final review. Patient agreeable to disposition. Will close encounter

## 2014-03-09 ENCOUNTER — Telehealth: Payer: Self-pay | Admitting: Obstetrics & Gynecology

## 2014-03-09 NOTE — Telephone Encounter (Signed)
Patient thinks she had an ovarian cyst rupture last night.

## 2014-03-09 NOTE — Telephone Encounter (Signed)
Spoke with patient. She states "I am still a little sore, but I am getting better".  Advised patient to continue with monitoring and rest and Tramadol as prescribed. Offered to assist patient with scheduling appointment to see Dr. Loreta AveMann and patient declines, she states she has their number and will call after we get off of the line. Patient advised to monitor symptoms and to call back if any new or worsening symptoms occur. Advised to call our office if develops fever, back pain, dysuria, nausea, vomiting, diarrhea, vaginal bleeding, unable to eat, tolerate fluids or vaginal discharge. Patient verbalizes understanding and will call back with concerns or any issues with appointment with Dr. Loreta AveMann.   Routing to provider for final review. Patient agreeable to disposition. Will close encounter

## 2014-03-09 NOTE — Telephone Encounter (Signed)
Message left to return call to Moesha Sarchet at [phone].    

## 2014-03-09 NOTE — Telephone Encounter (Signed)
Spoke with Dr. Hyacinth MeekerMiller.    Message left to return call to [place] at [phone].

## 2014-03-09 NOTE — Telephone Encounter (Signed)
Spoke with patient. She states she was sitting at work at her desk yesterday at approximately 1630 and felt "sudden pain on my right side and all of a sudden it just hit". Patient states the pain went away after 20 minutes and then pain decreased. Describes pain as "dull ache" on lower right side that she rates as 4/10 at this time, last took Tramadol today at 0730. Patient is currently at work today. Patient denies fevers, Denies n/v/d, no vaginal bleeding, no vaginal discharge, no dysuria. Last normal BM yesterday morning. States "I just feel drained, run down".  LMP 3/12, patient currently not on any birth control, states she has not been sexually active in the last month.  Advised would discuss with Dr. Hyacinth MeekerMiller and return call with any further advise. Patient agreeable.  Patient is to follow up with Dr. Loreta AveMann, states she received a voicemail from their office but has not returned call. Encouraged patient to return their call to schedule as this was next in her plan of care as she recently had pelvic ultrasound.

## 2014-03-29 ENCOUNTER — Telehealth: Payer: Self-pay | Admitting: Obstetrics & Gynecology

## 2014-03-29 NOTE — Telephone Encounter (Signed)
Per correspondence with Dr. Trilby DrummerManns office, patient cancelled appointment and did not want to reschedule.

## 2014-04-02 ENCOUNTER — Other Ambulatory Visit: Payer: Self-pay | Admitting: Obstetrics & Gynecology

## 2014-04-02 NOTE — Telephone Encounter (Signed)
Pleas call pt.  #30 with 1 RF given 03/01/14.  Has she taken 60 of these in the last month?  If so, she really needs to proceed with additional evaluation as that is a LOT of tramadol.  Referral was made but she didn't go for appt.  Please let me know if she has taken this much.

## 2014-04-02 NOTE — Telephone Encounter (Signed)
Last AEX 05/12/2013  Last refil 03/01/3014 #30/1 refill Next appt 05/13/2014   Please approve or deny Rx.

## 2014-04-02 NOTE — Telephone Encounter (Signed)
-   LM for patient to call back re: Rx refill.  

## 2014-04-05 NOTE — Telephone Encounter (Signed)
[place] is [place]

## 2014-04-05 NOTE — Telephone Encounter (Signed)
Patient states she was having a lot of pain and that's why she was using tramadol regularly [facility] and they stated it was severe constipation she was referred [place] [facility] [place]. Patient states she still have some pills left and now she is not using a lot of tramadol but still takes it occasionally. Advise patient I will send this message to Dr. Hyacinth MeekerMiller and will call her back if she has any other questions. - Patient agreed.

## 2014-04-06 NOTE — Telephone Encounter (Signed)
Dr. Hyacinth MeekerMiller,  This message was routed to me in error.  This is a patient of yours requesting a refill on Tramadol.

## 2014-04-07 NOTE — Addendum Note (Signed)
Addended by: Dion BodyBELTRAN, Kaira Stringfield C on: 04/07/2014 04:37 PM   Modules accepted: Orders

## 2014-04-08 MED ORDER — TRAMADOL HCL 50 MG PO TABS: ORAL_TABLET | ORAL | Status: DC

## 2014-04-08 NOTE — Telephone Encounter (Signed)
Rx done.  Will need to be faxed.

## 2014-04-08 NOTE — Addendum Note (Signed)
Addended by: Jerene BearsMILLER, Alessandra Sawdey S on: 04/08/2014 08:46 AM   Modules accepted: Orders

## 2014-04-09 NOTE — Telephone Encounter (Signed)
RX faxed

## 2014-05-13 ENCOUNTER — Ambulatory Visit (INDEPENDENT_AMBULATORY_CARE_PROVIDER_SITE_OTHER): Payer: Managed Care, Other (non HMO) | Admitting: Obstetrics & Gynecology

## 2014-05-13 ENCOUNTER — Ambulatory Visit (INDEPENDENT_AMBULATORY_CARE_PROVIDER_SITE_OTHER): Payer: Managed Care, Other (non HMO)

## 2014-05-13 VITALS — BP 100/60 | HR 86 | Resp 16 | Wt 136.0 lb

## 2014-05-13 DIAGNOSIS — N83209 Unspecified ovarian cyst, unspecified side: Secondary | ICD-10-CM

## 2014-05-13 DIAGNOSIS — N83201 Unspecified ovarian cyst, right side: Secondary | ICD-10-CM

## 2014-05-13 DIAGNOSIS — R1031 Right lower quadrant pain: Secondary | ICD-10-CM

## 2014-05-13 MED ORDER — TRAMADOL HCL 50 MG PO TABS: ORAL_TABLET | ORAL | Status: DC

## 2014-05-13 NOTE — Progress Notes (Signed)
43 y.o.Marriedfemale here for a pelvic ultrasound.  Pt with hx of recurrent RLQ pain.  At times, she feels there is a water balloon in her pelvis.  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  Was removed 6/06.  Pain seems to have started then.  Pt did have a negative laparoscopy in 5/12.     No LMP recorded.  Sexually active:  yes  Contraception: condoms  FINDINGS: UTERUS: 7.8 x 6.0 x 4.0cm EMS: 10.49mm ADNEXA:   Left ovary 3.1 x 1.5 x 1.3cm   Right ovary 4.1 x 3.2 x 3.1cm with 2.0cm simple cyst and 1.8cm collapsing corpus luteal cyst.  Both appear avascular.  CUL DE SAC: no free fluid  Reviewed images with pt and reviewed prior images as well.  When pt has symptoms and undergoes PUS, she always has cystic areas on the right ovary.  They appear different from ultrasound to ultrasound but they are always present when she is symptomatic.  She reports she feels she almost always ovulates from this ovary.  Pt is a long term smoker but is using e-cigarettes only now.  D/W pt ovulation suppression with OCPs as long as she continues not to smoke cigarettes.  I think this would help to suppress the activity on the right ovary.  Do not feel RSO is the correct option for her due to the fact that this ovary may be more active than the left and RSO might push her into menopause earlier.  She is also still contemplating a second child.  Of course, this would prevent pain as well.  She is REALLY thinking about this and will let me know when she definitively decides.   Assessment:  Recurrent RLQ pain Plan: consider OCP for ovulation suppression, as long as pt continues to not smoke.  She will let me know if she decides to proceed.  Discussed OCP risks of DVT/PE, elevated BP, stroke.  Voices understanding.  ~20 minutes spent with patient >50% of time was in face to face discussion of above.

## 2014-05-14 ENCOUNTER — Encounter: Payer: Self-pay | Admitting: Obstetrics & Gynecology

## 2014-07-27 ENCOUNTER — Other Ambulatory Visit: Payer: Self-pay | Admitting: Obstetrics & Gynecology

## 2014-07-27 NOTE — Telephone Encounter (Signed)
Last AEX: 6/101/14 Last refill:05/13/13 # 60, 1 ref Current AEX:NS  Please advise

## 2014-07-29 NOTE — Telephone Encounter (Signed)
Rx faxed to pharmacy Encounter closed

## 2014-09-01 ENCOUNTER — Other Ambulatory Visit: Payer: Self-pay

## 2014-09-01 NOTE — Telephone Encounter (Signed)
Last AEX: 05/12/13 Last refill:07/29/14 #30 X 0 Current AEX:NS  Per Dr. Hyacinth MeekerMiller direction, pt can not exceed 5 additional fill before 10/06/14 Refills: 05/12/13  11/10/13   01/21/14  04/02/14  07/27/14 Denied due to this is the 6th refill.  Encounter closed

## 2014-09-02 ENCOUNTER — Other Ambulatory Visit: Payer: Self-pay | Admitting: *Deleted

## 2014-09-02 NOTE — Telephone Encounter (Signed)
Faxed refill request received from [facility] for TR[facility]OL Last filled by MD (Dr. Hyacinth MeekerMiller) on 07/30/14, #30 X 0, with note "not to exceed 5 additional refills before 10/06/14".  This will be first refill request since 07/30/14.  Pt had PUS on 05/13/14 due to recurrent RLQ pain due to ovarian cyst and is trying to avoid RSO that may put her into early menopause.  Please advise if refill OK.

## 2014-09-03 MED ORDER — TRAMADOL HCL 50 MG PO TABS: 50.0000 mg | ORAL_TABLET | Freq: Four times a day (QID) | ORAL | Status: DC | PRN

## 2014-09-03 NOTE — Telephone Encounter (Signed)
Please contact patient to have her schedule annual exam with Dr. Hyacinth MeekerMiller.  She is overdue.

## 2014-09-03 NOTE — Telephone Encounter (Signed)
Message left for pt to return call regarding refill. RX did not print.  [facility] [place] [facility]

## 2014-09-27 ENCOUNTER — Other Ambulatory Visit: Payer: Self-pay | Admitting: Obstetrics & Gynecology

## 2014-09-27 NOTE — Telephone Encounter (Signed)
Last refill 09/03/14 #30/0 R Last AEX 05/12/14 No future appt scheduled   Please advise.

## 2014-09-29 NOTE — Telephone Encounter (Signed)
Rx has been faxed Encounter closed 

## 2014-10-04 ENCOUNTER — Encounter: Payer: Self-pay | Admitting: Obstetrics & Gynecology

## 2014-11-01 ENCOUNTER — Other Ambulatory Visit: Payer: Self-pay | Admitting: Obstetrics & Gynecology

## 2014-11-01 NOTE — Telephone Encounter (Signed)
Last refilled: 09/28/14 #30/0 rfs Last AEX: 05/12/14 with Dr. Hyacinth MeekerMiller AEX Scheduled:   Please Advise.

## 2014-11-01 NOTE — Telephone Encounter (Signed)
Last AEX 05/12/13  Last refill 09/28/14 #30/0R No future appt  Please advise

## 2014-11-05 NOTE — Telephone Encounter (Signed)
rx has been faxed 

## 2014-12-14 ENCOUNTER — Other Ambulatory Visit: Payer: Self-pay | Admitting: Obstetrics & Gynecology

## 2014-12-14 NOTE — Telephone Encounter (Signed)
Medication refill request: Tramadol Last AEX:  05/12/13 Next AEX: not scheduled Last MMG (if hormonal medication request): None Refill authorized: 11/04/14 #30/1R. Today denied?  Called pt to schedule AEX. Left message to call back and schedule appt.

## 2014-12-15 NOTE — Telephone Encounter (Signed)
Yes should be denied as was done with RF just under a month ago.  Thanks.

## 2014-12-28 ENCOUNTER — Telehealth: Payer: Self-pay | Admitting: Obstetrics & Gynecology

## 2014-12-28 NOTE — Telephone Encounter (Signed)
Dion BodyReina C Beltran, CMA at 12/14/2014 3:56 PM     Status: Signed       Expand All Collapse All   Medication refill request: Tramadol Last AEX: 05/12/13 Next AEX: not scheduled Last MMG (if hormonal medication request): None Refill authorized: 11/04/14 #30/1R. Today denied?  Called pt to schedule AEX. Left message to call back and schedule appt.      Called pt she scheduled AEX 02/16/15 with Mrs Eunice BlaseDebbie.

## 2014-12-28 NOTE — Telephone Encounter (Signed)
Routing to [place] as I do not see opened telephone note for patient.

## 2014-12-28 NOTE — Telephone Encounter (Signed)
Patient says she is returning a call to [name] [name] from a few weeks ago, no open TC note.Last seen 05/13/14.

## 2015-01-03 ENCOUNTER — Other Ambulatory Visit: Payer: Self-pay | Admitting: Obstetrics & Gynecology

## 2015-01-03 NOTE — Telephone Encounter (Signed)
Medication refill request: Tramadol 50 mg Last AEX:  05/12/13 Next AEX: 02/17/16 Last MMG (if hormonal medication request): ? Refill authorized: 11/04/14 #30/1R. Today?

## 2015-01-07 NOTE — Telephone Encounter (Signed)
[facility]

## 2015-01-07 NOTE — Telephone Encounter (Signed)
[facility] status of refill listed in previous message for tramadol  [facility]: [phone]

## 2015-02-02 ENCOUNTER — Other Ambulatory Visit: Payer: Self-pay | Admitting: Obstetrics & Gynecology

## 2015-02-02 NOTE — Telephone Encounter (Signed)
Medication refill request: Tramadol 50 mg Last AEX:  05/12/13 SM Next AEX: 02/16/15 DL Last MMG (if hormonal medication request): none Refill authorized: 01/07/15 #30/0R. Today please advise

## 2015-02-16 ENCOUNTER — Encounter: Payer: Self-pay | Admitting: Certified Nurse Midwife

## 2015-02-16 ENCOUNTER — Ambulatory Visit (INDEPENDENT_AMBULATORY_CARE_PROVIDER_SITE_OTHER): Payer: Managed Care, Other (non HMO) | Admitting: Certified Nurse Midwife

## 2015-02-16 VITALS — BP 110/68 | HR 70 | Resp 16 | Ht 69.75 in | Wt 145.0 lb

## 2015-02-16 DIAGNOSIS — Z01419 Encounter for gynecological examination (general) (routine) without abnormal findings: Secondary | ICD-10-CM | POA: Diagnosis not present

## 2015-02-16 DIAGNOSIS — Z0000 Encounter for general adult medical examination without abnormal findings: Secondary | ICD-10-CM | POA: Diagnosis not present

## 2015-02-16 DIAGNOSIS — Z124 Encounter for screening for malignant neoplasm of cervix: Secondary | ICD-10-CM | POA: Diagnosis not present

## 2015-02-16 LAB — POCT URINALYSIS DIPSTICK
Bilirubin, UA: NEGATIVE
GLUCOSE UA: NEGATIVE
Ketones, UA: NEGATIVE
LEUKOCYTES UA: NEGATIVE
Nitrite, UA: NEGATIVE
PROTEIN UA: NEGATIVE
UROBILINOGEN UA: NEGATIVE
pH, UA: 5

## 2015-02-16 LAB — HEMOGLOBIN, FINGERSTICK: Hemoglobin, fingerstick: 13.5 g/dL (ref 12.0–16.0)

## 2015-02-16 NOTE — Progress Notes (Signed)
44 y.o. G2P1 Married  Caucasian Fe here for annual exam.  Periods normal no issues. Contraception condoms. Has small mole on neck that she would like to have checked. Sees PCP prn. Has not had first mammogram. Continues with menstrual migraines, but manages with Excedrin. No other health issues today.  Patient's last menstrual period was 02/13/2015.          Sexually active: Yes.    The current method of family planning is condoms all the time.    Exercising: No.  exercise Smoker:  no  Health Maintenance: Pap: 04-10-12 neg MMG:  none Colonoscopy:  none BMD:   none TDaP:  2013 Labs: Poct urine-rbc tr (on cycle), Hgb-13.5 Self breast exam: done monthly   reports that she has been smoking E-cigarettes.  She has been smoking about 0.50 packs per day. She has never used smokeless tobacco. She reports that she does not drink alcohol or use illicit drugs.  Past Medical History  Diagnosis Date  . Migraine     without aura  . Endocervical polyp 4/07    benign    Past Surgical History  Procedure Laterality Date  . Tonsillectomy and adenoidectomy    . Laparoscopic ovarian cystectomy  05/07/05    right (7cm)/secondary to mucinous cystadenoma  . Diagnostic laparoscopy  5/12    secondary to pain/negative    Current Outpatient Prescriptions  Medication Sig Dispense Refill  . Calcium-Magnesium-Vitamin D (CALCIUM 500 PO) Take by mouth daily.    . cetirizine (ZYRTEC) 10 MG tablet Take 10 mg by mouth as needed.     Marland Kitchen ibuprofen (ADVIL,MOTRIN) 200 MG tablet Take 200 mg by mouth every 6 (six) hours as needed for pain.    . traMADol (ULTRAM) 50 MG tablet TAKE 1 TO 2 TABLETS EVERY 6 HOURS AS NEEDED 30 tablet 0   No current facility-administered medications for this visit.    Family History  Problem Relation Age of Onset  . Hypertension Father   . Thyroid disease Mother     thyroidectomy  . Osteoporosis Mother   . Osteoporosis Maternal Grandmother   . Breast cancer Paternal Aunt 41  .  Ovarian cancer Paternal Aunt     ROS:  Pertinent items are noted in HPI.  Otherwise, a comprehensive ROS was negative.  Exam:   BP 110/68 mmHg  Pulse 70  Resp 16  Ht 5' 9.75" (1.772 m)  Wt 145 lb (65.772 kg)  BMI 20.95 kg/m2  LMP 02/13/2015 Height: 5' 9.75" (177.2 cm) Ht Readings from Last 3 Encounters:  02/16/15 5' 9.75" (1.772 m)  03/01/14 5' 9.75" (1.772 m)  05/12/13 5' 9.75" (1.772 m)    General appearance: alert, cooperative and appears stated age Head: Normocephalic, without obvious abnormality, atraumatic Neck: no adenopathy, supple, symmetrical, trachea midline and thyroid normal to inspection and palpation Lungs: clear to auscultation bilaterally Breasts: normal appearance, no masses or tenderness, No nipple retraction or dimpling, No nipple discharge or bleeding, No axillary or supraclavicular adenopathy Heart: regular rate and rhythm Abdomen: soft, non-tender; no masses,  no organomegaly Extremities: extremities normal, atraumatic, no cyanosis or edema Skin: Skin color, texture, turgor normal. No rashes or lesions, small slightly irregular pea size brown colored mole on right neck area Lymph nodes: Cervical, supraclavicular, and axillary nodes normal. No abnormal inguinal nodes palpated Neurologic: Grossly normal   Pelvic: External genitalia:  no lesions              Urethra:  normal appearing urethra with no masses,  tenderness or lesions              Bartholin's and Skene's: normal                 Vagina: normal appearing vagina with normal color and discharge, no lesions              Cervix: normal,no lesions,non tender              Pap taken: Yes.   Bimanual Exam:  Uterus:  normal size, contour, position, consistency, mobility, non-tender              Adnexa: normal adnexa and no mass, fullness, tenderness               Rectovaginal: Confirms               Anus:  normal sphincter tone, no lesions  Chaperone present: Yes  A:  Well Woman with normal  exam  Contraception condoms  Menstrual migraines with OTC use   Mole with slight irregular border, but no change in pigmentation  Ovulation pain uses Tramadol    P:   Reviewed health and wellness pertinent to exam  Discussed rapid release tylenol at onset of headache if Excedrin does not relieve  Recommended skin check with Dermatology, given [facility] information  Pap smear taken today with HPVHR   counseled on breast self exam, mammography screening stressed with information given to schedule, adequate intake of calcium and vitamin D, diet and exercise  return annually or prn  An After Visit Summary was printed and given to the patient.

## 2015-02-16 NOTE — Patient Instructions (Signed)

## 2015-02-17 ENCOUNTER — Other Ambulatory Visit: Payer: Self-pay | Admitting: Certified Nurse Midwife

## 2015-02-17 MED ORDER — TRAMADOL HCL 50 MG PO TABS: 50.0000 mg | ORAL_TABLET | Freq: Four times a day (QID) | ORAL | Status: DC | PRN

## 2015-02-17 NOTE — Telephone Encounter (Signed)
RX printed, signed by Dr. Hyacinth MeekerMiller [facility] LM on patient's vm that rx has been sent.

## 2015-02-17 NOTE — Telephone Encounter (Signed)
Medication refill request: Tramadol 50 mg  Last AEX:  02/16/15 with SM Next AEX: 02/17/16 with SM Last filled: 01/07/15 #30/0 rfs Refill authorized: #30/0 rfs, please advise

## 2015-02-17 NOTE — Telephone Encounter (Signed)
Patient was seen 02/16/15 and was expecting refills on her Tramadol but the pharmacy did not receive the refills. Pharmacy on file is correct.

## 2015-02-17 NOTE — Progress Notes (Signed)
Reviewed personally.  M. Suzanne Tekila Caillouet, MD.  

## 2015-02-18 LAB — IPS PAP TEST WITH HPV

## 2015-02-21 ENCOUNTER — Telehealth: Payer: Self-pay

## 2015-02-21 NOTE — Telephone Encounter (Signed)
No answer/Try again

## 2015-02-21 NOTE — Telephone Encounter (Signed)
-----   Message from Deborah S Leonard, CNM sent at 02/21/2015  7:37 AM EDT ----- Notify patient that pap smear is abnormal with ASCUS but HPVHR not detected, one year follow up pap with HPVHR needed. No further follow up at this time per ASCCP guidelines, but important to repeat in one year 08 

## 2015-02-24 NOTE — Telephone Encounter (Signed)
Patient notified of results. See lab 

## 2015-03-01 ENCOUNTER — Telehealth: Payer: Self-pay

## 2015-03-01 NOTE — Telephone Encounter (Signed)
lmtcb

## 2015-03-01 NOTE — Telephone Encounter (Signed)
Returning a call to Joy. °

## 2015-03-01 NOTE — Telephone Encounter (Signed)
-----   Message from Verner Choleborah S Leonard, CNM sent at 02/21/2015  7:37 AM EDT ----- Notify patient that pap smear is abnormal with ASCUS but HPVHR not detected, one year follow up pap with HPVHR needed. No further follow up at this time per ASCCP guidelines, but important to repeat in one year 08

## 2015-03-08 NOTE — Telephone Encounter (Signed)
Pt was previously already notified.

## 2015-07-05 DIAGNOSIS — F411 Generalized anxiety disorder: Secondary | ICD-10-CM | POA: Insufficient documentation

## 2015-10-25 IMAGING — US US ABDOMEN LIMITED
1 series · 14 of 25 positions shown · non-contrast
Comparison: Lumbar CT myelogram 02/11/2009.

CLINICAL DATA: 42-year-old female with right upper quadrant pain.
Constipation. Initial encounter.

EXAM:
US ABDOMEN LIMITED - RIGHT UPPER QUADRANT

[Series 1: us abdomen limited · 0.27mm/px · 14 of 43 slices shown]
[im 1/43]
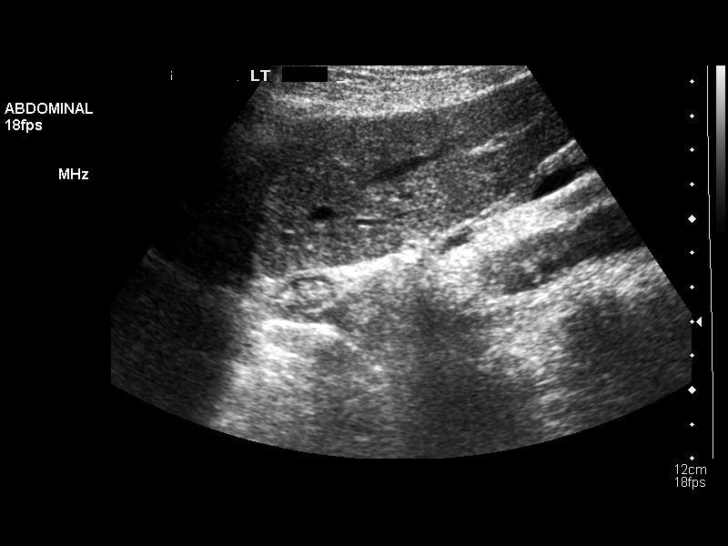
[im 4/43]
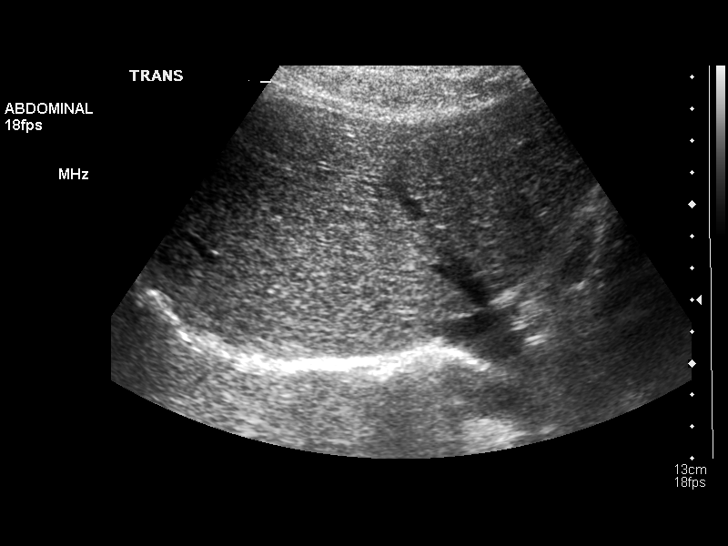
[im 8/43]
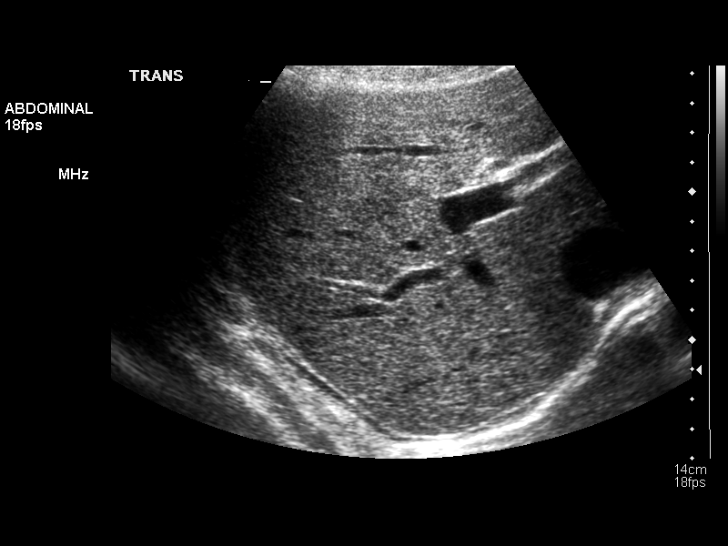
[im 11/43]
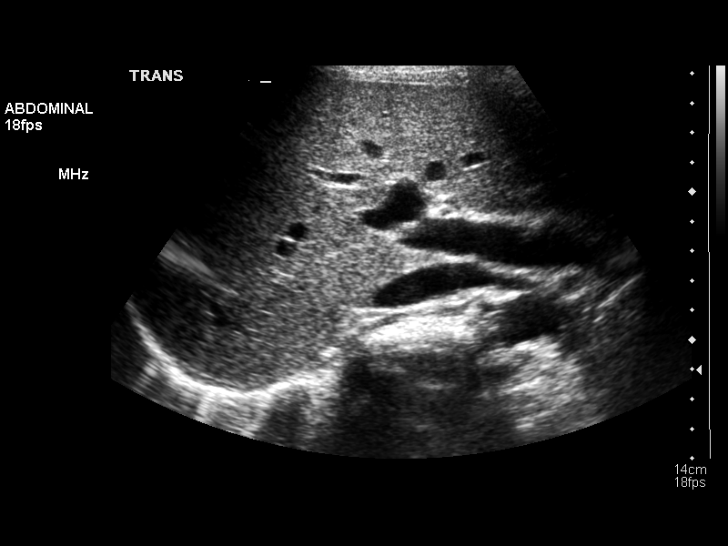
[im 15/43]
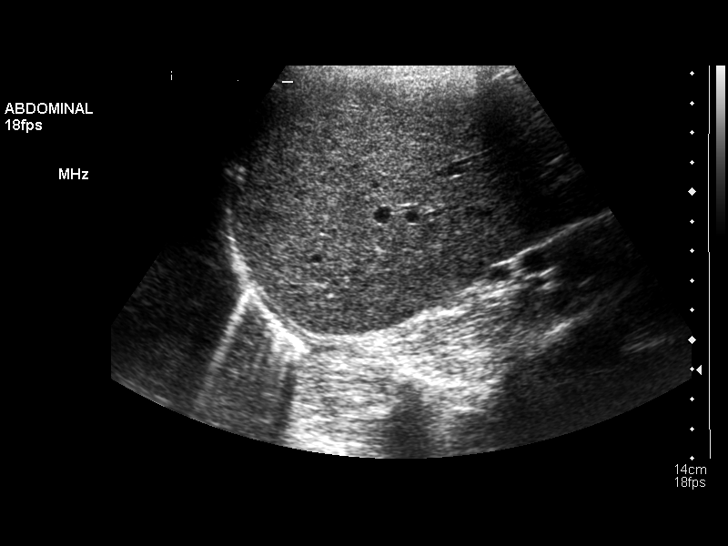
[im 16/43]
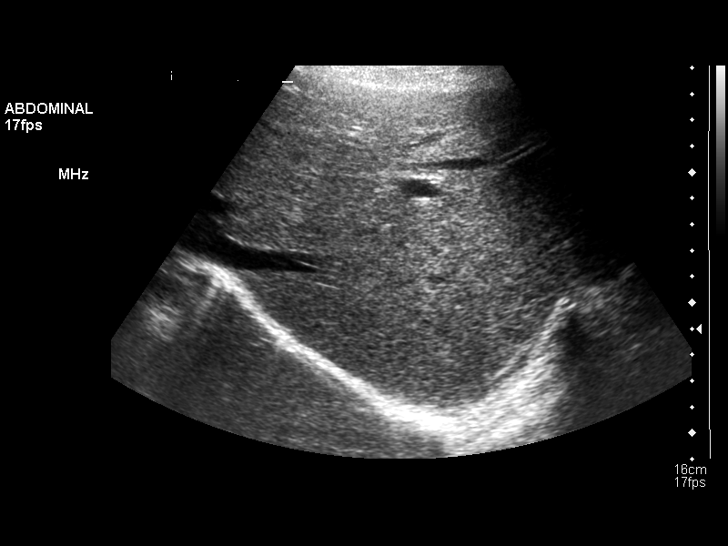
[im 20/43]
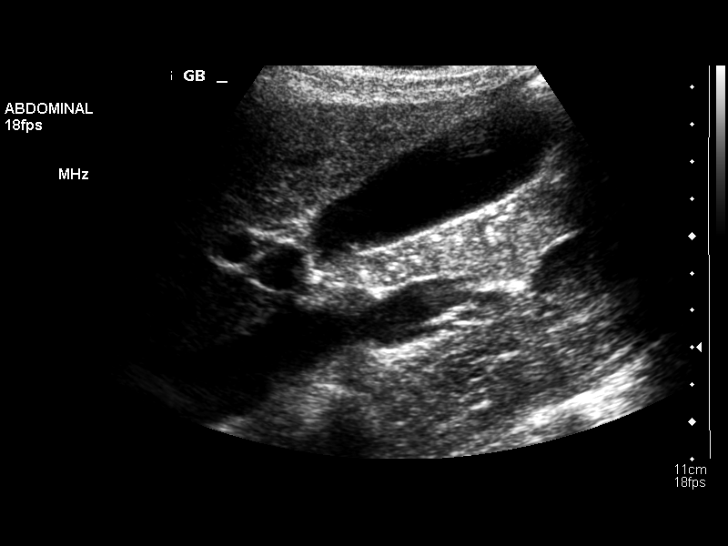
[im 23/43]
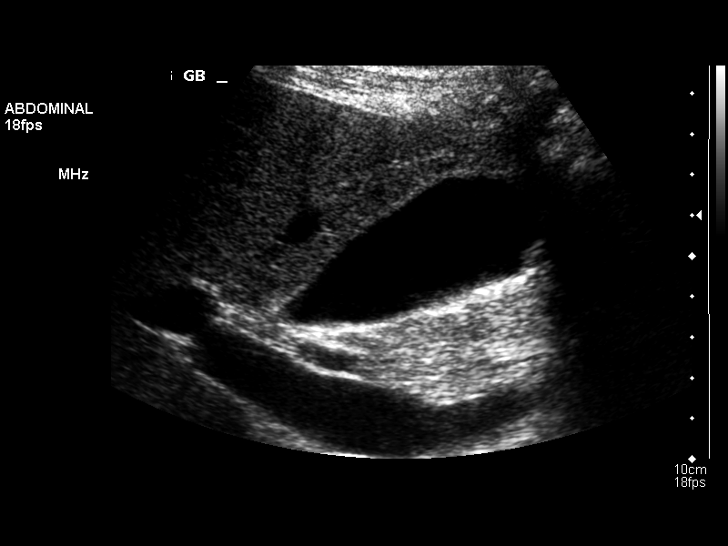
[im 27/43]
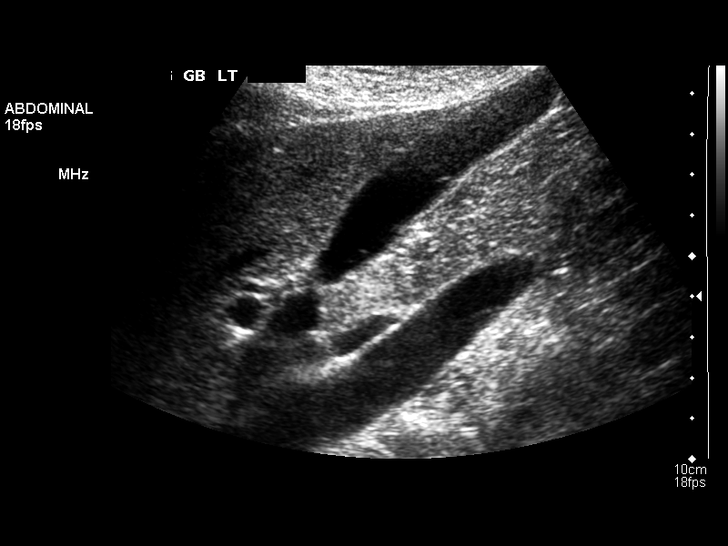
[im 29/43]
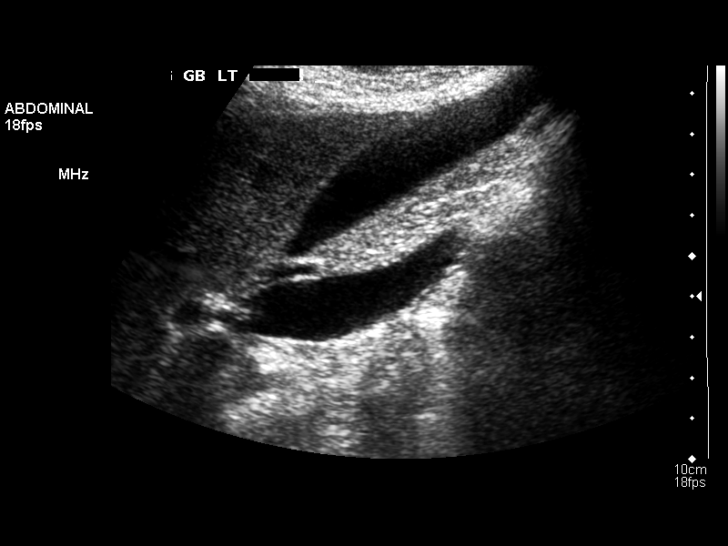
[im 32/43]
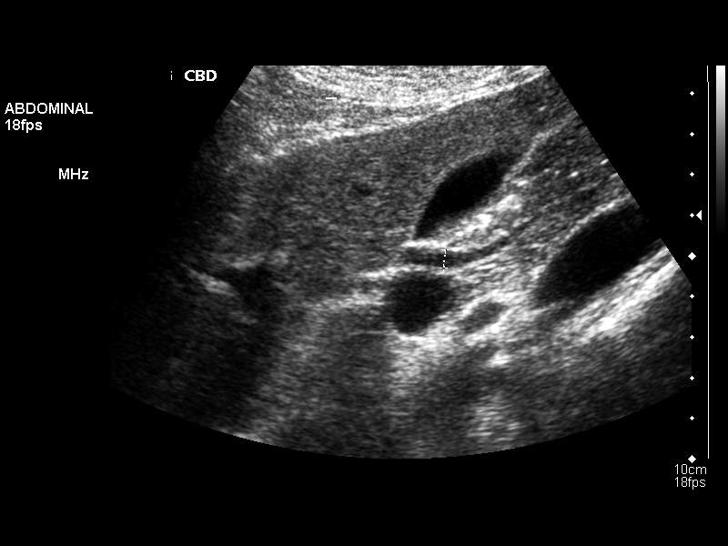
[im 36/43]
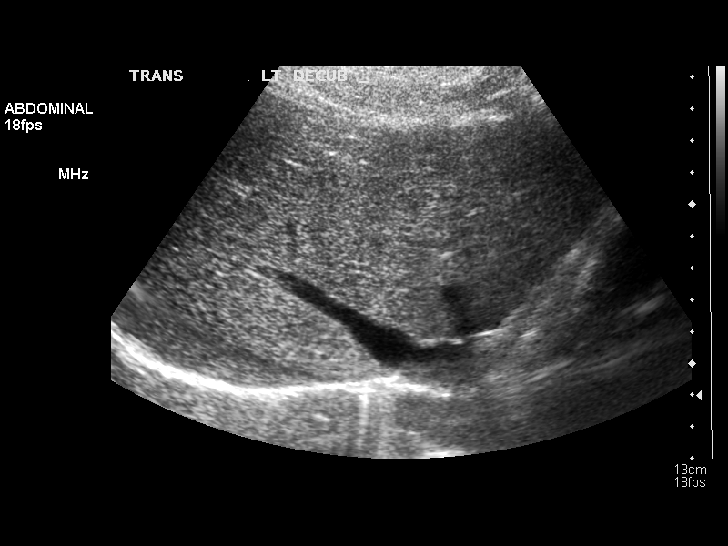
[im 39/43]
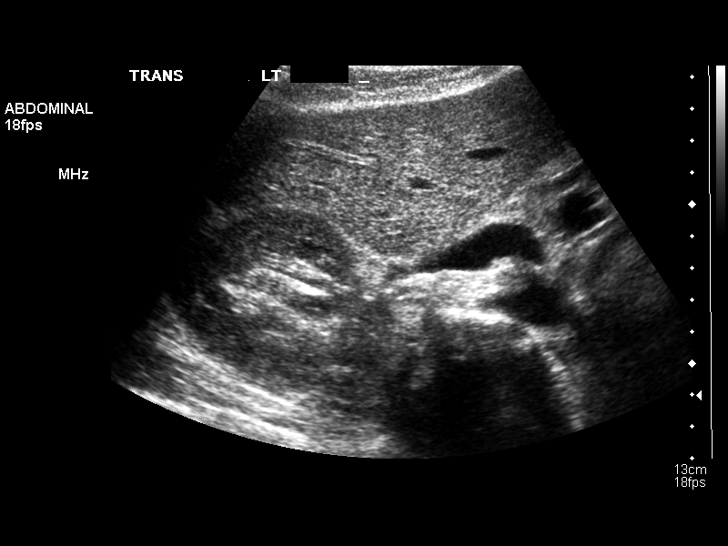
[im 43/43]
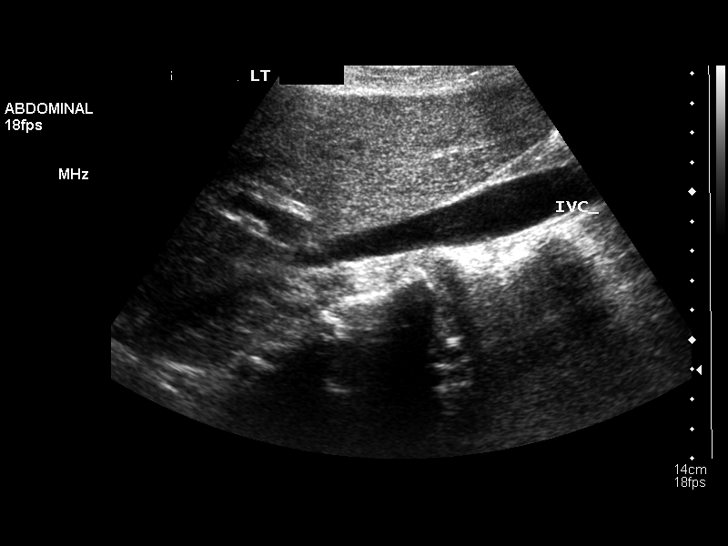

[14 of 25 positions shown; findings below may reference images not displayed]

FINDINGS: Gallbladder:

No gallstones or wall thickening visualized. No sonographic Murphy
sign noted.

Common bile duct:

Diameter: 5 mm, normal.

Liver:

Echogenicity within normal limits. No intrahepatic ductal
dilatation. No focal liver lesion.

Other findings:  No free fluid.  Negative visible right kidney.
IMPRESSION: Normal gallbladder.  Negative right upper quadrant ultrasound.

## 2015-12-27 ENCOUNTER — Telehealth: Payer: Self-pay | Admitting: Emergency Medicine

## 2015-12-27 ENCOUNTER — Encounter: Payer: Self-pay | Admitting: Obstetrics & Gynecology

## 2015-12-27 MED ORDER — TRAMADOL HCL 50 MG PO TABS: 50.0000 mg | ORAL_TABLET | Freq: Four times a day (QID) | ORAL | Status: DC | PRN

## 2015-12-27 NOTE — Telephone Encounter (Signed)
Rx signed.  This needs to be faxed and sent to pharmacy.

## 2015-12-27 NOTE — Telephone Encounter (Signed)
Chief Complaint  Patient presents with  . Medication Management    Patient sent mychart request for refill of Tramadol     ===View-only below this line===   ----- Message -----    From: Burtch,Kamari A    Sent: 12/27/2015  1:42 PM EST      To: Annamaria Boots, MD Subject: Non-Urgent Medical Question  Hello,  I [facility] a [facility] but I haven't heard anything.  I just wanted to make sure that you received it.  I haven't filled it since the beginning of last year, so I wasn't sure if that would cause an issue.  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  Now that it has healed up, I only have the monthly ovulation pain to deal with, so I submitted the request.    Thank you! Gean Birchwood

## 2015-12-27 NOTE — Telephone Encounter (Signed)
Telephone call for triage created to discuss message with patient and disposition as appropriate.   

## 2015-12-27 NOTE — Telephone Encounter (Signed)
[facility] faxed today to [facility]

## 2015-12-27 NOTE — Telephone Encounter (Signed)
Routing request to Dr. Hyacinth Meeker.  Patient has annual exam 02/17/16 with Dr. Hyacinth Meeker.  Last ordered Tramadol by Dr. Sunday Corn 02/17/15.

## 2015-12-28 NOTE — Telephone Encounter (Signed)
Patient notified via mychart that her prescription was faxed.  Routing to provider for final review. Patient agreeable to disposition. Will close encounter.

## 2016-02-17 ENCOUNTER — Encounter: Payer: Self-pay | Admitting: Obstetrics & Gynecology

## 2016-02-17 ENCOUNTER — Ambulatory Visit (INDEPENDENT_AMBULATORY_CARE_PROVIDER_SITE_OTHER): Payer: Managed Care, Other (non HMO) | Admitting: Obstetrics & Gynecology

## 2016-02-17 VITALS — BP 96/56 | HR 68 | Resp 18 | Ht 69.75 in | Wt 149.0 lb

## 2016-02-17 DIAGNOSIS — R1031 Right lower quadrant pain: Secondary | ICD-10-CM | POA: Diagnosis not present

## 2016-02-17 DIAGNOSIS — Z0000 Encounter for general adult medical examination without abnormal findings: Secondary | ICD-10-CM

## 2016-02-17 DIAGNOSIS — Z01419 Encounter for gynecological examination (general) (routine) without abnormal findings: Secondary | ICD-10-CM | POA: Diagnosis not present

## 2016-02-17 LAB — COMPREHENSIVE METABOLIC PANEL WITH GFR
ALT: 19 U/L (ref 6–29)
AST: 22 U/L (ref 10–30)
Albumin: 4.6 g/dL (ref 3.6–5.1)
Alkaline Phosphatase: 53 U/L (ref 33–115)
BUN: 7 mg/dL (ref 7–25)
CO2: 26 mmol/L (ref 20–31)
Calcium: 9.2 mg/dL (ref 8.6–10.2)
Chloride: 103 mmol/L (ref 98–110)
Creat: 0.69 mg/dL (ref 0.50–1.10)
Glucose, Bld: 88 mg/dL (ref 65–99)
Potassium: 3.9 mmol/L (ref 3.5–5.3)
Sodium: 138 mmol/L (ref 135–146)
Total Bilirubin: 0.3 mg/dL (ref 0.2–1.2)
Total Protein: 6.9 g/dL (ref 6.1–8.1)

## 2016-02-17 LAB — POCT URINALYSIS DIPSTICK
Bilirubin, UA: NEGATIVE
Blood, UA: NEGATIVE
Glucose, UA: NEGATIVE
KETONES UA: NEGATIVE
LEUKOCYTES UA: NEGATIVE
Nitrite, UA: NEGATIVE
PROTEIN UA: NEGATIVE
UROBILINOGEN UA: NEGATIVE
pH, UA: 5

## 2016-02-17 LAB — LIPID PANEL
Cholesterol: 188 mg/dL (ref 125–200)
HDL: 53 mg/dL (ref >=46)
LDL Cholesterol: 117 mg/dL (ref <130)
Total CHOL/HDL Ratio: 3.5 ratio (ref <=5.0)
Triglycerides: 89 mg/dL (ref <150)
VLDL: 18 mg/dL (ref <30)

## 2016-02-17 LAB — CBC
HEMATOCRIT: 40.5 % (ref 36.0–46.0)
HEMOGLOBIN: 13.2 g/dL (ref 12.0–15.0)
MCH: 30 pg (ref 26.0–34.0)
MCHC: 32.6 g/dL (ref 30.0–36.0)
MCV: 92 fL (ref 78.0–100.0)
MPV: 10 fL (ref 8.6–12.4)
Platelets: 247 10*3/uL (ref 150–400)
RBC: 4.4 MIL/uL (ref 3.87–5.11)
RDW: 14.4 % (ref 11.5–15.5)
WBC: 7.1 10*3/uL (ref 4.0–10.5)

## 2016-02-17 LAB — TSH: TSH: 1.28 mIU/L

## 2016-02-17 MED ORDER — TRAMADOL HCL 50 MG PO TABS: 50.0000 mg | ORAL_TABLET | Freq: Four times a day (QID) | ORAL | Status: DC | PRN

## 2016-02-17 NOTE — Progress Notes (Signed)
45 y.o. [id number] MarriedCaucasianF here for annual exam.  Doing well.  Cycles regular.  Has ovulation pain.  Flow has gotten heavier for the first two days.  Considering options for contraception.    Quit smoking this year due to severe bronchitis.    Patient's last menstrual period was 02/05/2016.          Sexually active: Yes.    The current method of family planning is condoms most of the time.    Exercising: No.  The patient does not participate in regular exercise at present. Smoker:  former  Health Maintenance: Pap:  02/16/15 ASCUS. HR HPV:Neg History of abnormal Pap:  yes MMG:  01/24/16 BIRADS1:neg Colonoscopy:  Never BMD:   Never TDaP:  2013 Screening Labs: TSH check, Urine today: pending   reports that she quit smoking about 7 months ago. Her smoking use included E-cigarettes. She smoked 0.50 packs per day. She has never used smokeless tobacco. She reports that she does not drink alcohol or use illicit drugs.  Past Medical History  Diagnosis Date  . Migraine     without aura  . Endocervical polyp 4/07    benign    Past Surgical History  Procedure Laterality Date  . Tonsillectomy and adenoidectomy    . Laparoscopic ovarian cystectomy  05/07/05    right (7cm)/secondary to mucinous cystadenoma  . Diagnostic laparoscopy  5/12    secondary to pain/negative    Current Outpatient Prescriptions  Medication Sig Dispense Refill  . Calcium-Magnesium-Vitamin D (CALCIUM 500 PO) Take by mouth daily.    . cetirizine (ZYRTEC) 10 MG tablet Take 10 mg by mouth as needed.     . traMADol (ULTRAM) 50 MG tablet Take 1-2 tablets (50-100 mg total) by mouth every 6 (six) hours as needed. 30 tablet 0   No current facility-administered medications for this visit.    Family History  Problem Relation Age of Onset  . Hypertension Father   . Thyroid disease Mother     thyroidectomy  . Osteoporosis Mother   . Osteoporosis Maternal Grandmother   . Breast cancer Paternal Aunt 52  . Ovarian  cancer Paternal Aunt     ROS:  Pertinent items are noted in HPI.  Otherwise, a comprehensive ROS was negative.  Exam:   BP 96/56 mmHg  Pulse 68  Resp 18  Ht 5' 9.75" (1.772 m)  Wt 149 lb (67.586 kg)  BMI 21.52 kg/m2  LMP 02/05/2016  Weight change: #139   Height: 5' 9.75" (177.2 cm)  Ht Readings from Last 3 Encounters:  02/17/16 5' 9.75" (1.772 m)  02/16/15 5' 9.75" (1.772 m)  03/01/14 5' 9.75" (1.772 m)    General appearance: alert, cooperative and appears stated age Head: Normocephalic, without obvious abnormality, atraumatic Neck: no adenopathy, supple, symmetrical, trachea midline and thyroid normal to inspection and palpation Lungs: clear to auscultation bilaterally Breasts: normal appearance, no masses or tenderness Heart: regular rate and rhythm Abdomen: soft, non-tender; bowel sounds normal; no masses,  no organomegaly Extremities: extremities normal, atraumatic, no cyanosis or edema Skin: Skin color, texture, turgor normal. No rashes or lesions Lymph nodes: Cervical, supraclavicular, and axillary nodes normal. No abnormal inguinal nodes palpated Neurologic: Grossly normal   Pelvic: External genitalia:  no lesions              Urethra:  normal appearing urethra with no masses, tenderness or lesions              Bartholins and Skenes: normal  Vagina: normal appearing vagina with normal color and discharge, no lesions              Cervix: no lesions              Pap taken: No. Bimanual Exam:  Uterus:  normal size, contour, position, consistency, mobility, non-tender              Adnexa: normal adnexa and no mass, fullness, tenderness               Rectovaginal: Confirms               Anus:  normal sphincter tone, no lesions  Chaperone was present for exam.  A:  Well woman with normal exam Contraception condoms Menstrual migraines with OTC use  Ovulation pain treated with Tramadol Smoking cessation 7 months ago  P: Mammogram  recommendations discussed Pap with neg HR HPV 2016.  No pap today. Lipids, TSH, CMP, CBC, Vit D Tramadol 50mg  #30/2RF IUD information given for pt information AEX 1 year or follow up prn

## 2016-02-17 NOTE — Patient Instructions (Signed)
Joaquin CourtsStacey Blythe, MD

## 2016-02-22 NOTE — Addendum Note (Signed)
Addended by: Jerene BearsMILLER, [place] Marney S on: 02/22/2016 07:52 AM   Modules accepted: Kipp BroodSmartSet

## 2016-04-16 ENCOUNTER — Other Ambulatory Visit: Payer: Self-pay | Admitting: Obstetrics & Gynecology

## 2016-04-16 NOTE — Telephone Encounter (Signed)
Medication refill request: Ultram Last AEX:  02-17-16 Next AEX: 05-31-17 Last MMG (if hormonal medication request): 01-24-16 WNL Refill authorized: please advise

## 2016-04-16 NOTE — Telephone Encounter (Signed)
Prescription for tramadol 50 mg #30, 1 [facility] [place] fax # [phone] per Dr. Hyacinth MeekerMiller

## 2016-06-11 ENCOUNTER — Other Ambulatory Visit: Payer: Self-pay | Admitting: Obstetrics & Gynecology

## 2016-06-11 NOTE — Telephone Encounter (Signed)
Can you please let pt know that we got a RF request for this.  We checked with the pharmacy about this and she is getting it from two providers so Rx was declined.  She should not be getting this from two providers.

## 2016-06-11 NOTE — Telephone Encounter (Signed)
Called patient.  No answer. Unable to leave voicemail

## 2016-06-11 NOTE — Telephone Encounter (Signed)
[facility] got the refill we sent on 5/15, and 6/4 and on 6/16 she got another tramadol refill #60  by provider Conchita Parisana [place].

## 2016-06-11 NOTE — Telephone Encounter (Signed)
Can you confim if pt has used both RX and RF since given in May?  Thanks.

## 2016-06-11 NOTE — Telephone Encounter (Signed)
Medication refill request: tramadol  Last AEX:  02/17/16 SM Next AEX: 05/31/17 SM  Last MMG (if hormonal medication request): 01/24/16 BIRADS1:neg Refill authorized: 04/16/16 #30tabs/1R today please advise.

## 2016-08-13 ENCOUNTER — Telehealth: Payer: Self-pay | Admitting: Obstetrics & Gynecology

## 2016-08-13 NOTE — Telephone Encounter (Signed)
Spoke with patient. Patient reports her LMP started on 07/29/2016 and was 1 week early. Blood was dark brown for 2 days and then turned bright red. On 07/31/2016 bleeding became heavy with increased cramping. Menses ended on 08/01/2016. Reports she started bleeding again on 08/10/2016. Bleeding was dark brown for 2 days and is now bright red in color. Reports bleeding is "moderate." Patient is changing her tampon every 5 hours. Denies any pain, fatigue, light headedness, or dizziness. Current form of birth control is condom use. Advised patient she will need to be seen in the office for further evaluation. She is agreeable. Appointment scheduled for 08/14/2016 at 10:45 am with Dr.Miller. She is agreeable to date and time.  Routing to provider for final review. Patient agreeable to disposition. Will close encounter.

## 2016-08-13 NOTE — Telephone Encounter (Signed)
Patient is asking to talk with Dr.Miller's nurse about some changes to her cycle.

## 2016-08-14 ENCOUNTER — Ambulatory Visit (INDEPENDENT_AMBULATORY_CARE_PROVIDER_SITE_OTHER): Payer: Managed Care, Other (non HMO) | Admitting: Obstetrics & Gynecology

## 2016-08-14 ENCOUNTER — Other Ambulatory Visit: Payer: Self-pay

## 2016-08-14 ENCOUNTER — Encounter: Payer: Self-pay | Admitting: Obstetrics & Gynecology

## 2016-08-14 ENCOUNTER — Ambulatory Visit (INDEPENDENT_AMBULATORY_CARE_PROVIDER_SITE_OTHER): Payer: Managed Care, Other (non HMO)

## 2016-08-14 VITALS — BP 110/70 | HR 68 | Resp 12 | Ht 70.0 in | Wt 147.8 lb

## 2016-08-14 DIAGNOSIS — N926 Irregular menstruation, unspecified: Secondary | ICD-10-CM

## 2016-08-14 DIAGNOSIS — N83201 Unspecified ovarian cyst, right side: Secondary | ICD-10-CM | POA: Diagnosis not present

## 2016-08-14 DIAGNOSIS — N838 Other noninflammatory disorders of ovary, fallopian tube and broad ligament: Secondary | ICD-10-CM

## 2016-08-14 NOTE — Progress Notes (Signed)
GYNECOLOGY  VISIT   HPI: 45 y.o. [id number] Married Caucasian female here with complaint of irregular bleeding.  Reports around 8/25, she started to have dark spotting that looked like old blood.  Then three days later, she had two waves of RLQ pain.  Then after these episodes, regular bleeding started but this wasn't heavy and then the bleeding on 8/30.  However, this started back on 9/8, she started having dark brown spotting again and then had a regular cycle.  She had cramping but episodic RLQ pain like earlier in August.    These cycles are different for her because she doesn't typically have dark brown spotting with her cycles.    Typically cycles are fairly regular and last about 5 days with two being heavier and then the bleeding tapers off.  She also typically has menstrual cramping.  Last pap was 3/16 was ASCUS with neg HR HPV.   GYNECOLOGIC HISTORY: Patient's last menstrual period was 08/10/2016. Contraception:  condoms   Patient Active Problem List   Diagnosis Date Noted  . Anxiety, generalized 07/05/2015  . ELBOW PAIN, RIGHT 08/24/2010  . LATERAL EPICONDYLITIS, RIGHT 08/24/2010  . LUMBAGO 04/20/2008  . 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 NECK PAIN 02/10/2007    Past Medical History:  Diagnosis Date  . Endocervical polyp 4/07   benign  . Migraine    without aura    Past Surgical History:  Procedure Laterality Date  . DIAGNOSTIC LAPAROSCOPY  5/12   secondary to pain/negative  . 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 TONSILLECTOMY AND ADENOIDECTOMY      MEDS:  Reviewed in EPIC and UTD  ALLERGIES: Clarithromycin  Family History  Problem Relation Age of Onset  . Hypertension Father   . Thyroid disease Mother     thyroidectomy  . Osteoporosis Mother   . Osteoporosis Maternal Grandmother   . Breast cancer Paternal Aunt 3  . Ovarian cancer Paternal Aunt     SH:  Married, non  smoker  ROS   PHYSICAL EXAMINATION:    BP 110/70 (BP Location: Right Arm, Patient Position: Sitting, Cuff Size: Normal)   Pulse 68   Resp 12   Ht 5\' 10"  (1.778 m)   Wt 147 lb 12.8 oz (67 kg)   LMP 08/10/2016   BMI 21.21 kg/m     General appearance: alert, cooperative and appears stated age Abdomen: soft, non-tender; bowel sounds normal; no masses,  no organomegaly  Pelvic: External genitalia:  no lesions              Urethra:  normal appearing urethra with no masses, tenderness or lesions              Bartholins and Skenes: normal                 Vagina: normal appearing vagina with normal color and discharge, no lesions              Cervix: no lesions              Bimanual Exam:  Uterus:  normal size, contour, position, consistency, mobility, non-tender              Adnexa: right ovary feels about 4cm in size, mobile              Anus:  normal sphincter tone, no lesions  Chaperone was present for exam.  Assessment: Irregular bleeding for the last  month with two irregular cycles Right enlarged ovary  Plan: FSH and TSH obtained today Needs PUS scheduled

## 2016-08-14 NOTE — Progress Notes (Signed)
45 y.o. [id number] Married Caucasian female here for pelvic ultrasound due to irregular bleeding this past month and enlarged right ovary noted on physical exam earlier today.  Patient's last menstrual period was 08/10/2016.  Contraception: condoms  Findings:   UTERUS: 7.8 x 5.7 x 3.1cm EMS: 3.395mm ADNEXA: Left ovary:  2.8 x 1.7  1.9cm with small follicle       Right ovary: 4.4 x 3.5 x 2.8cm with 3.3 x 2.4cm simple cyst CUL DE SAC: no free fluid  Discussion:  Findings reviewed with pt.  I feel cyst and irregular bleeding this month are likely related.  She did have FSH and TSH obtained earlier today.  She will be called with these results.  As cyst is completely simple in appearance, follow up ultrasound is not needed.  She is going to monitor bleeding and let me know when she has next episode of bleeding.  Hopefully, this will just resolve on its own.  Pt comfortable with plan. .  Assessment:  Irregular bleeding 3.3cm simple right ovarian cyst  Plan:  FSH and TSH obtained.  Pt will call and let me know about her next cycle.  Will decide then if hormonal therapy is indicated.   ~15 minutes spent with patient discussing ultrasound in addition to time spent in visit earlier today discussing bleeding and performing pelvic exam.

## 2016-08-14 NOTE — Progress Notes (Addendum)
Documentation regarding PUS made on note of same date of service but later in the day.  Please see that note.

## 2016-08-15 LAB — TSH: TSH: 1.33 m[IU]/L

## 2016-08-15 LAB — FOLLICLE STIMULATING HORMONE: FSH: 7.2 m[IU]/mL

## 2016-09-02 HISTORY — PX: SHOULDER SURGERY: SHX246

## 2017-03-11 ENCOUNTER — Encounter: Payer: Self-pay | Admitting: Obstetrics & Gynecology

## 2017-03-11 ENCOUNTER — Telehealth: Payer: Self-pay | Admitting: *Deleted

## 2017-03-11 NOTE — Telephone Encounter (Signed)
Patient is returning a call to Jill. °

## 2017-03-11 NOTE — Telephone Encounter (Signed)
See telephone encounter dated 03/11/17. 

## 2017-03-11 NOTE — Telephone Encounter (Signed)
Left message to call Noreene Larsson at [phone].   From [place]ank To Jerene Bears, MD Sent 03/11/2017 8:47 AM  Hi Dr. Hyacinth Meeker,   I wanted to see if you would possibly refill Tramadol for me for the ovulation/ovary issues that have been ongoing. I ended up having to have shoulder surgery back in October and because I was dealing with that, I haven't requested a refill in along time. That is why I didn't [facility].  I was going to wait until my yearly exam, but my appointment isn't until the end of June. If you can refill it, [facility] [address] [facility] [place].   Thank you!  Shawna Orleans

## 2017-03-12 ENCOUNTER — Encounter: Payer: Self-pay | Admitting: Obstetrics & Gynecology

## 2017-03-12 ENCOUNTER — Ambulatory Visit (INDEPENDENT_AMBULATORY_CARE_PROVIDER_SITE_OTHER): Payer: Managed Care, Other (non HMO) | Admitting: Obstetrics & Gynecology

## 2017-03-12 VITALS — BP 120/76 | HR 78 | Resp 16 | Ht 70.0 in | Wt 148.2 lb

## 2017-03-12 DIAGNOSIS — R102 Pelvic and perineal pain: Secondary | ICD-10-CM | POA: Diagnosis not present

## 2017-03-12 DIAGNOSIS — G8929 Other chronic pain: Secondary | ICD-10-CM

## 2017-03-12 MED ORDER — TRAMADOL HCL 50 MG PO TABS: 50.0000 mg | ORAL_TABLET | Freq: Four times a day (QID) | ORAL | 1 refills | Status: DC | PRN

## 2017-03-12 NOTE — Progress Notes (Signed)
Patient ID: [name] [name], female   DOB: 1971/08/18, 46 y.o.   MRN: [id number] GYNECOLOGY  VISIT   HPI: 46 y.o. [id number] Married Caucasian female here for discussion of worsening menstrual cycles.  Flow lasts five days.  At ovulation, she tends to have bloating, mood changes, some cramping that is always on the right.  It itensiifies until right when bleeding starts.  Flow is heavy for first two days but then lessens.  Does not have intermenstrual spotting.  She states she feels like she only has two good weeks every month.  She is asymptomatic from right after her cycle ends until mid-cycle when she starts to have some bloating and mild cramping.  This seems to worsen as well as the bloating continues until bleeding starts.  She has not made an active decision about future childbearing but she is open to considering options for treatment.  She does not want to do anything that is permanent.  She does not want to be on oral combination OCPs.    D/W pt IUD use for decreasing flow and hopeful improvement in pain.  As her pain is worse with bleeding, I feel anything that will decrease bleeding will improve pain.  Also, discussed with pt use of prozac starting with cycle for two weeks--PMDD treatment.  Side effects, efficacy, dosing reviewed.  She is more interested in IUD use at this time.    GYNECOLOGIC HISTORY: Patient's last menstrual period was 02/25/2017. Contraception: condoms  Patient Active Problem List   Diagnosis Date Noted  . Anxiety, generalized 07/05/2015  . ELBOW PAIN, RIGHT 08/24/2010  . LATERAL EPICONDYLITIS, RIGHT 08/24/2010  . LUMBAGO 04/20/2008  . 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 NECK PAIN 02/10/2007    Past Medical History:  Diagnosis Date  . Endocervical polyp 4/07   benign  . Migraine    without aura    Past Surgical History:  Procedure Laterality Date  . DIAGNOSTIC LAPAROSCOPY  5/12   secondary to pain/negative  . 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 SHOULDER SURGERY Right 09/2016  . TONSILLECTOMY AND ADENOIDECTOMY      MEDS:  Reviewed in EPIC and UTD  ALLERGIES: Clarithromycin  Family History  Problem Relation Age of Onset  . Hypertension Father   . Thyroid disease Mother     thyroidectomy  . Osteoporosis Mother   . Osteoporosis Maternal Grandmother   . Breast cancer Paternal Aunt 64  . Ovarian cancer Paternal Aunt     SH:  Married, non smoker  ROS  PHYSICAL EXAMINATION:    BP 120/76 (BP Location: Right Arm, Patient Position: Sitting, Cuff Size: Normal)   Pulse 78   Resp 16   Ht  (1.778 m)   Wt 148 lb 3.2 oz (67.2 kg)   LMP 02/25/2017   BMI 21.26 kg/m     General appearance: alert, cooperative and appears stated age  Assessment: RLQ pain.  h/o laparoscopy 5/12 that was negative Symptoms consistent with PMDD  Plan: RF for ultram  1-2 every 6 hour prn.  #30/1RF given  Information for Mirena IUD provided.  Pt will review.  Will precert.  Pt not interested in trial of prozac at this time.   ~20 minutes spent with patient >50% of time was in face to face discussion of above.

## 2017-03-12 NOTE — Progress Notes (Deleted)
Subjective:     Patient ID: [name] [name], female   DOB: 1971-05-23, 46 y.o.   MRN: [id number]   HPI   Review of Systems     Objective:   Physical Exam     Assessment:     ***    Plan:     ***

## 2017-03-12 NOTE — Telephone Encounter (Signed)
Spoke with patient. Patient states she has been experiencing the same issues with ovulation as she has in the past. Patient states she feels like a water balloon is attached in her abdomen, hurts with walking, sitting, moving. Patient states she has had surgery for possible endometriosis and PUS for possible cyst, all negative. Patient states pain is continuous, but not any worse. Patient states she finally gets relief about 2 days into cycle, reports cycles as regular, but heavy. Denies vaginal discharge, odor or urinary complaints. Patient asking for refill of tramadol, advised would need OV for evaluation.  Last OV 08/14/16. Recommended OV for further evaluation, patient scheduled for today at 3pm with Dr. Hyacinth Meeker. Patient verbalizes understanding and is agreeable.  Routing to provider for final review. Patient is agreeable to disposition. Will close encounter.

## 2017-04-16 ENCOUNTER — Other Ambulatory Visit: Payer: Self-pay | Admitting: Obstetrics & Gynecology

## 2017-04-16 NOTE — Telephone Encounter (Signed)
Medication refill request: Tramadol Last AEX:  02/17/16 SM Next AEX: 05/31/17 SM Last MMG (if hormonal medication request): 01/24/16 Duwaine [email or web address], [facility] Refill authorized: 03/12/17 #30 1R. Please advise. Thank you.

## 2017-04-18 ENCOUNTER — Telehealth: Payer: Self-pay

## 2017-04-18 NOTE — Telephone Encounter (Signed)
error 

## 2017-04-18 NOTE — Telephone Encounter (Signed)
Can you please call the pharmacy and check on this refill.  She was given #30 and 1 RF in April so she should have some remaining.  Thanks.

## 2017-04-18 NOTE — Telephone Encounter (Signed)
Per pharmacy, patient picked up last refill on 04/09/17 #20. Thanks.

## 2017-04-18 NOTE — Telephone Encounter (Addendum)
[facility] [address] [place] for Tramdol Hcl 50mg . Last refilled with Dr. Hyacinth MeekerMiller at their pharmacy, 03/25/17 #30 w/1 refill. Per pharmacy, patient has filled Tramadol [facility] [address], [place] from: Dwan BoltWilliam Morgan - 03/05/17 #10 w/0 refills  Then [facility] [address] [place] from:  Dr. Hyacinth MeekerMiller - 03/12/17 #30        03/25/17 #30 Basil DessKatheryn Meiners - 04/09/17 #20 w/1 refill  Per Dr. Hyacinth MeekerMiller, patient needs to call the office. Refill denied.    Dr. Hyacinth MeekerMiller, Lorain ChildesFYI, encounter closed.

## 2017-04-19 NOTE — Telephone Encounter (Signed)
Please let her know she's gotten this prescription from three difference providers in the last three months.  She's received #30 x 2, #20 x 1, and #15 x 1 per pharmacy records.  If she needs this much, she needs to make some new plans about how this is going to be managed.  95 tablets in the last 3 months is just too much.  We have reviewed options in the past.  I'm not trying to force her to make a decision but I cannot write this if she is going to get it from other providers as well.    I'm sorry but she will need another OV for us to review and make additional plans.

## 2017-04-19 NOTE — Telephone Encounter (Signed)
Spoke with patient, advised as seen below per Dr. Hyacinth MeekerMiller. Patient states she had recent shoulder surgery and was careful to keep that and ovary pain separated, can see how this may look. Patient scheduled for OV on 04/22/17 at 2pm with Dr. Hyacinth MeekerMiller. Patient verbalizes understanding and is agreeable.  Routing to provider for final review. Patient is agreeable to disposition. Will close encounter.

## 2017-04-19 NOTE — Telephone Encounter (Signed)
Spoke with patient. Patient calling for update on refill request for tramadol. Advised patient request denied, see refill encounter dated 04/16/17. Patient states she is experiencing same pain as she has had for years, ovary and ovulation pain. Recommended OV for further evaluation, patient declined. Patient states she came in for OV last time and was told by Dr. Hyacinth MeekerMiller OV not needed for refill. Advised patient last RX sent on 4/10 #30, 1/RF. Patient states she was planning ahead for her next cycle. Again recommended OV for further evaluation, patient declined. Advised would update Dr. Hyacinth MeekerMiller and return call with any additional recommendations, patient is agreeable.   Dr. Hyacinth MeekerMiller, any additional recommendations? Per review of refill encounter dated 04/16/17, also prescribed tramadol by Glean HessKatheryn Meiners - 04/09/17 #20 w/1 refill.

## 2017-04-19 NOTE — Telephone Encounter (Signed)
Patient wants to speak with the nurse about her medication refill.

## 2017-04-22 ENCOUNTER — Ambulatory Visit (INDEPENDENT_AMBULATORY_CARE_PROVIDER_SITE_OTHER): Payer: Managed Care, Other (non HMO) | Admitting: Obstetrics & Gynecology

## 2017-04-22 ENCOUNTER — Encounter: Payer: Self-pay | Admitting: Obstetrics & Gynecology

## 2017-04-22 VITALS — BP 120/70 | HR 80 | Resp 16 | Ht 70.0 in | Wt 145.8 lb

## 2017-04-22 DIAGNOSIS — R1031 Right lower quadrant pain: Secondary | ICD-10-CM | POA: Diagnosis not present

## 2017-04-22 DIAGNOSIS — R102 Pelvic and perineal pain: Secondary | ICD-10-CM | POA: Diagnosis not present

## 2017-04-22 DIAGNOSIS — G8929 Other chronic pain: Secondary | ICD-10-CM

## 2017-04-22 MED ORDER — TRAMADOL HCL 50 MG PO TABS: 50.0000 mg | ORAL_TABLET | Freq: Four times a day (QID) | ORAL | 2 refills | Status: DC | PRN

## 2017-04-22 NOTE — Progress Notes (Signed)
GYNECOLOGY  VISIT   HPI: 46 y.o. [id number] Married Caucasian female here for follow up of pelvic pain and to discuss the amount of Tramadol she's been receiving.  Pt states she does not want to use narcotics and when is prescribed pain medication, is asking for Tramadol.    She reports two recent events causing her to use Tramadol more regularly.  First, she reports she fractured a tooth on a butterscotch candy a couple of months ago.  She had to have a root canal in march.  Was prescribed Vicodin and Ultram.  Did not take the Vicodin and only took the Ultram.  Prior to that, she had shoulder surgery 09/14/16.  She did receive Ultram for this.  With her PT, she was advised to do shoulder strengthening exercises.  These exercises were really painful for her.  She felt about a month ago, or so, she injured the other shoulder.  She was prescribed Ultram for this but it was just overuse and the pain has resolved.    D/W pt that she has gotten this rx from three different providers in the last three months.  I really think she needs to consider some other option for her LLQ pain the starts midcycle and then improves until her cycle begins.  Then she has pain again.  This is all cycle related.  Denies back pain, constipation/diarrhea, fever, urinary symptoms.  Pt had normal ultrasound 9/17.  GYNECOLOGIC HISTORY: Patient's last menstrual period was 04/11/2017. Contraception: condoms  Patient Active Problem List   Diagnosis Date Noted  . Anxiety, generalized 07/05/2015  . ELBOW PAIN, RIGHT 08/24/2010  . LATERAL EPICONDYLITIS, RIGHT 08/24/2010  . LUMBAGO 04/20/2008  . 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 NECK PAIN 02/10/2007    Past Medical History:  Diagnosis Date  . Endocervical polyp 4/07   benign  . Migraine    without aura    Past Surgical History:  Procedure Laterality Date  . DIAGNOSTIC LAPAROSCOPY  5/12   secondary to pain/negative  . 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 SHOULDER SURGERY Right 09/2016  . TONSILLECTOMY AND ADENOIDECTOMY      MEDS:  Reviewed in EPIC and UTD  ALLERGIES: Clarithromycin  Family History  Problem Relation Age of Onset  . Hypertension Father   . Thyroid disease Mother        thyroidectomy  . Osteoporosis Mother   . Osteoporosis Maternal Grandmother   . Breast cancer Paternal Aunt 3550  . Ovarian cancer Paternal Aunt     SH:  Married, non smoker  Review of Systems  Gastrointestinal: Positive for abdominal pain. Diarrhea: RLQ.  All other systems reviewed and are negative.   PHYSICAL EXAMINATION:    BP 120/70 (BP Location: Right Arm, Patient Position: Sitting, Cuff Size: Normal)   Pulse 80   Resp 16   Ht 5\' 10"  (1.778 m)   Wt 145 lb 12.8 oz (66.1 kg)   LMP 04/11/2017   BMI 20.92 kg/m     Physical Exam  Constitutional: She appears well-developed and well-nourished.  Psychiatric: She has a normal mood and affect.   No other exam performed today.  Discussion:  Pt and I have discussed using something else to help with pain for her.  We have discussed repeat laparoscopy, IUD, OCPs, and even hysterectomy.  Advised I really think she needs to made a decision and proceed with, at least, trying something else.  I am very  glad that she is not using narcotics but she has used an unusual amount of Ultram the last two months.  She voices understanding of the way this appears given she's gotten this from three different providers but she does feel she has legitimate reasons for using it in the way she did.  She does state she would like improvement in pain.  She does not want to have any other children, so she is most interested in a Mirena IUD.  Will precert this for her her.  She is not using anything but condoms for contraception so this would give her better contraception as well.  She is very comfortable with this plan.  Assessment: Intermittent but  chronic RLQ pain  Plan: Will precert Mirena IUD.  Pt aware will plan to place this with her next cycle. Ultram 50mg  1-2 every 6 hours prn.  #30/1RF   ~30 minutes spent with patient >50% of time was in face to face discussion of above.

## 2017-04-24 ENCOUNTER — Telehealth: Payer: Self-pay | Admitting: Obstetrics & Gynecology

## 2017-04-24 DIAGNOSIS — R102 Pelvic and perineal pain: Principal | ICD-10-CM

## 2017-04-24 DIAGNOSIS — G8929 Other chronic pain: Secondary | ICD-10-CM | POA: Insufficient documentation

## 2017-04-24 NOTE — Telephone Encounter (Signed)
Called patient to review benefits for a Mirena IUD insertion. Left Voicemail requesting a call back.   cc: Dr Hyacinth MeekerMiller

## 2017-05-02 NOTE — Telephone Encounter (Signed)
Patient returned call. Reviewed benefit for a Mirena IUD / insertion. Patient understood information presented and is agreeable. Patient would like to proceed with the appointment. Patient stated her last monthly period was approximately two weeks ago. Advised patient to call at the onset of her cycle, to schedule. Patient is agreeable.    Routing to Dr Hyacinth MeekerMiller for final review

## 2017-05-31 ENCOUNTER — Ambulatory Visit (INDEPENDENT_AMBULATORY_CARE_PROVIDER_SITE_OTHER): Payer: Managed Care, Other (non HMO) | Admitting: Obstetrics & Gynecology

## 2017-05-31 ENCOUNTER — Encounter: Payer: Self-pay | Admitting: Obstetrics & Gynecology

## 2017-05-31 VITALS — BP 112/60 | HR 80 | Resp 16 | Ht 69.5 in | Wt 145.0 lb

## 2017-05-31 DIAGNOSIS — Z01419 Encounter for gynecological examination (general) (routine) without abnormal findings: Secondary | ICD-10-CM | POA: Diagnosis not present

## 2017-05-31 NOTE — Progress Notes (Signed)
46 y.o. [id number] MarriedCaucasianF here for annual exam.  Planing on having IUD placed.  Has been traveling the past two months.  Cycles are regular.    Patient's last menstrual period was 05/08/2017.          Sexually active: Yes.    The current method of family planning is condoms every time.    Exercising: Yes.    walking Smoker:  Former   Health Maintenance: Pap:  02/16/15 ASCUS. HR HPV:neg  History of abnormal Pap:  yes MMG:  01/24/16 BIRADS1:neg  Colonoscopy:  Never BMD:   Never  TDaP:  04/2012 Pneumonia vaccine(s):  N/A Zostavax:   N/A Hep C testing: N/A Screening Labs: Will discuss   reports that she quit smoking about 22 months ago. Her smoking use included E-cigarettes. She smoked 0.50 packs per day. She has never used smokeless tobacco. She reports that she does not drink alcohol or use drugs.  Past Medical History:  Diagnosis Date  . Endocervical polyp 4/07   benign  . Migraine    without aura    Past Surgical History:  Procedure Laterality Date  . DIAGNOSTIC LAPAROSCOPY  5/12   secondary to pain/negative  . 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 SHOULDER SURGERY Right 09/2016  . TONSILLECTOMY AND ADENOIDECTOMY      Current Outpatient Prescriptions  Medication Sig Dispense Refill  . Calcium-Magnesium-Vitamin D (CALCIUM 500 PO) Take by mouth daily.    . cetirizine (ZYRTEC) 10 MG tablet Take 10 mg by mouth as needed.     Marland Kitchen. ibuprofen (ADVIL,MOTRIN) 800 MG tablet Take 800 mg by mouth 3 (three) times daily as needed.  2  . traMADol (ULTRAM) 50 MG tablet Take 1-2 tablets (50-100 mg total) by mouth every 6 (six) hours as needed. for pain 30 tablet 2   No current facility-administered medications for this visit.     Family History  Problem Relation Age of Onset  . Hypertension Father   . Thyroid disease Mother        thyroidectomy  . Osteoporosis Mother   . Osteoporosis Maternal Grandmother   . Breast cancer  Paternal Aunt 2850  . Ovarian cancer Paternal Aunt     ROS:  Pertinent items are noted in HPI.  Otherwise, a comprehensive ROS was negative.  Exam:   BP 112/60 (BP Location: Right Arm, Patient Position: Sitting, Cuff Size: Normal)   Pulse 80   Resp 16   Ht 5' 9.5" (1.765 m)   Wt 145 lb (65.8 kg)   LMP 05/08/2017   BMI 21.11 kg/m   Weight change: +4#  Height: 5' 9.5" (176.5 cm)  Ht Readings from Last 3 Encounters:  05/31/17 5' 9.5" (1.765 m)  04/22/17 5\' 10"  (1.778 m)  03/12/17 5\' 10"  (1.778 m)    General appearance: alert, cooperative and appears stated age Head: Normocephalic, without obvious abnormality, atraumatic Neck: no adenopathy, supple, symmetrical, trachea midline and thyroid normal to inspection and palpation Lungs: clear to auscultation bilaterally Breasts: normal appearance, no masses or tenderness Heart: regular rate and rhythm Abdomen: soft, non-tender; bowel sounds normal; no masses,  no organomegaly Extremities: extremities normal, atraumatic, no cyanosis or edema Skin: Skin color, texture, turgor normal. No rashes or lesions Lymph nodes: Cervical, supraclavicular, and axillary nodes normal. No abnormal inguinal nodes palpated Neurologic: Grossly normal   Pelvic: External genitalia:  no lesions              Urethra:  normal appearing urethra with no masses, tenderness or lesions              Bartholins and Skenes: normal                 Vagina: normal appearing vagina with normal color and discharge, no lesions              Cervix: no lesions              Pap taken: No. Bimanual Exam:  Uterus:  normal size, contour, position, consistency, mobility, non-tender              Adnexa: normal adnexa and no mass, fullness, tenderness               Rectovaginal: Confirms               Anus:  normal sphincter tone, no lesions  Chaperone was present for exam.  A:  Well Woman with normal exam Menstrual migraines Ovulation pain that pt treats with Tramadol Former  cancer  P:   Mammogram guidelines reviewed pap smear with HR HPV will be repeated next year Pt will call with cycle onset for IUD placement return annually or prn

## 2017-05-31 NOTE — Patient Instructions (Signed)
Remember to do a mammogram in February of next year.

## 2017-06-11 ENCOUNTER — Telehealth: Payer: Self-pay | Admitting: Obstetrics & Gynecology

## 2017-06-11 DIAGNOSIS — Z30430 Encounter for insertion of intrauterine contraceptive device: Secondary | ICD-10-CM

## 2017-06-11 NOTE — Telephone Encounter (Signed)
Spoke with patient. Patient started her menses on 06/10/2017 and is ready to schedule her IUD insertion. Appointment scheduled for 06/14/2017 at 10 am with Dr.Miller. Patient is agreeable to date and time. Pre procedure instructions given.  Motrin instructions given. Motrin=Advil=Ibuprofen, 800 mg one hour before appointment. Eat a meal and hydrate well before appointment. Patient verbalizes understanding. Order placed.  Routing to provider for final review. Patient agreeable to disposition. Will close encounter.

## 2017-06-11 NOTE — Telephone Encounter (Signed)
Patient has started her cycle and ready to schedule her IUD insertion.

## 2017-06-14 ENCOUNTER — Encounter: Payer: Self-pay | Admitting: Obstetrics & Gynecology

## 2017-06-14 ENCOUNTER — Ambulatory Visit (INDEPENDENT_AMBULATORY_CARE_PROVIDER_SITE_OTHER): Payer: Managed Care, Other (non HMO) | Admitting: Obstetrics & Gynecology

## 2017-06-14 VITALS — BP 110/74 | HR 88 | Resp 14 | Ht 69.5 in | Wt 146.4 lb

## 2017-06-14 DIAGNOSIS — Z01812 Encounter for preprocedural laboratory examination: Secondary | ICD-10-CM | POA: Diagnosis not present

## 2017-06-14 DIAGNOSIS — G43831 Menstrual migraine, intractable, with status migrainosus: Secondary | ICD-10-CM | POA: Diagnosis not present

## 2017-06-14 DIAGNOSIS — Z30430 Encounter for insertion of intrauterine contraceptive device: Secondary | ICD-10-CM

## 2017-06-14 LAB — POCT URINE PREGNANCY: Preg Test, Ur: NEGATIVE

## 2017-06-14 MED ORDER — SUMATRIPTAN SUCCINATE 100 MG PO TABS: 100.0000 mg | ORAL_TABLET | ORAL | 1 refills | Status: DC | PRN

## 2017-06-14 MED ORDER — MISOPROSTOL 200 MCG PO TABS: ORAL_TABLET | ORAL | 0 refills | Status: DC

## 2017-06-14 NOTE — Progress Notes (Signed)
46 y.o. [id number] Married Caucasian female presents for insertion of Mirena IUD due to needs for contraception and also to see if this will help with midcycle pelvic pain that she experiences each month.  Pt reports having a menstrual migraine this month that just won't go away.  Does not have any migraine medication.  Has been using anti-inflammatories which she knows can make it worse.    Pt has been counseled about options for contraception including OCPs, progesterone options, condoms, and natural family planning.  She feels IUD is the better option for her.  Pt has also been counseled about risks and benefits as well as complications.  Consent is obtained today.  All questions answered prior to start of procedure.    Current contraception:  Last STD testing:  condoms LMP:  Patient's last menstrual period was 06/10/2017.  Patient Active Problem List   Diagnosis Date Noted  . Chronic pelvic pain in female 04/24/2017  . Anxiety, generalized 07/05/2015  . ELBOW PAIN, RIGHT 08/24/2010  . LATERAL EPICONDYLITIS, RIGHT 08/24/2010  . LUMBAGO 04/20/2008  . 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 NECK PAIN 02/10/2007   Past Medical History:  Diagnosis Date  . Endocervical polyp 4/07   benign  . Migraine    without aura   Current Outpatient Prescriptions on File Prior to Visit  Medication Sig Dispense Refill  . Calcium-Magnesium-Vitamin D (CALCIUM 500 PO) Take by mouth daily.    . cetirizine (ZYRTEC) 10 MG tablet Take 10 mg by mouth as needed.     Marland Kitchen. ibuprofen (ADVIL,MOTRIN) 800 MG tablet Take 800 mg by mouth 3 (three) times daily as needed.  2  . traMADol (ULTRAM) 50 MG tablet Take 1-2 tablets (50-100 mg total) by mouth every 6 (six) hours as needed. for pain 30 tablet 2   No current facility-administered medications on file prior to visit.    Clarithromycin  ROS Vitals:   06/14/17 1005  BP: 110/74  Pulse: 88  Resp: 14  Weight: 146 lb 6.4 oz (66.4 kg)  Height: 5'  9.5" (1.765 m)    Gen:  WNWF healthy female NAD Abdomen: soft, non-tender Groin:  no inguinal nodes palpated  Pelvic exam: Vulva:  normal female genitalia Vagina:  normal vagina Cervix:  Non-tender, Negative CMT, no lesions or redness. Uterus:  normal shape, position and consistency   Procedure:  Speculum reinserted.  Cervix visualized and cleansed with Betadine x 3.  Single toothed tenaculum applied to anterior lip of cervix without difficulty.  Attempt made to sound uterus.  This was unsuccessful.  Milex dilator could be passed but no other dilators would pass.  Several attempts made but procedure ended due to pt discomfort.   A:  Attempted insertion of mirena IUD today.  Stopped due to pt discomfort Menstrual migraine without aura Possible PMDD  P:  Will return for insertion under ultrasound guidance Pt will use cytotec 200mcg pm and am before procedure Imitrex 100mg  po x 1, repeat in 2 hours.  Max 200mg  in 24 hours.  Advised pt to go to urgent care in AM if still has headache.

## 2017-06-14 NOTE — Progress Notes (Signed)
Patient scheduled while in office for US guided Mirena IUD placement on 06/25/17 at 4:30pm with Dr. Hyacinth MeekerMiller. Patient is agreeable to date and time.

## 2017-06-25 ENCOUNTER — Ambulatory Visit (INDEPENDENT_AMBULATORY_CARE_PROVIDER_SITE_OTHER): Payer: Managed Care, Other (non HMO) | Admitting: Obstetrics & Gynecology

## 2017-06-25 ENCOUNTER — Ambulatory Visit (INDEPENDENT_AMBULATORY_CARE_PROVIDER_SITE_OTHER): Payer: Managed Care, Other (non HMO)

## 2017-06-25 DIAGNOSIS — Z975 Presence of (intrauterine) contraceptive device: Secondary | ICD-10-CM

## 2017-06-25 DIAGNOSIS — Z30430 Encounter for insertion of intrauterine contraceptive device: Secondary | ICD-10-CM

## 2017-06-25 MED ORDER — TRAMADOL HCL 50 MG PO TABS: 50.0000 mg | ORAL_TABLET | Freq: Four times a day (QID) | ORAL | 2 refills | Status: DC | PRN

## 2017-06-25 NOTE — Progress Notes (Signed)
46 y.o. [id number] Married Caucasian female presents for insertion of Mirena IUD.  Pt has been counseled about alternative forms of contraception including OCPs, progesterone options, sterilization procedure, condoms, and natural family planning.  She feels IUD is the better option for her, not only for contraception but also to see if midcycle pain she experiences will improve.    Placement was attempted on 06/14/17 but I could not get anything more than a Milex dilator to pass.  Due to concerns for perforation, procedure was ended and pt was scheduled with ultrasound guidance.  Pt could not return until today but has been abstinent since last cycle which started 06/10/17  Current contraception:  Last STD testing:  none LMP:  Patient's last menstrual period was 06/10/2017.  Patient Active Problem List   Diagnosis Date Noted  . Chronic pelvic pain in female 04/24/2017  . Anxiety, generalized 07/05/2015  . ELBOW PAIN, RIGHT 08/24/2010  . LATERAL EPICONDYLITIS, RIGHT 08/24/2010  . LUMBAGO 04/20/2008  . 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 NECK PAIN 02/10/2007   Past Medical History:  Diagnosis Date  . Endocervical polyp 4/07   benign  . Migraine    without aura   Current Outpatient Prescriptions on File Prior to Visit  Medication Sig Dispense Refill  . Calcium-Magnesium-Vitamin D (CALCIUM 500 PO) Take by mouth daily.    . cetirizine (ZYRTEC) 10 MG tablet Take 10 mg by mouth as needed.     Marland Kitchen. ibuprofen (ADVIL,MOTRIN) 800 MG tablet Take 800 mg by mouth 3 (three) times daily as needed.  2  . misoprostol (CYTOTEC) 200 MCG tablet 1 tablet PV pm and am before procedure 2 tablet 0  . SUMAtriptan (IMITREX) 100 MG tablet Take 1 tablet (100 mg total) by mouth every 2 (two) hours as needed for migraine. May repeat in 2 hours if headache persists or recurs. 10 tablet 1   No current facility-administered medications on file prior to visit.    Clarithromycin  Review of Systems    All other systems reviewed and are negative.  Gen:  WNWF healthy female NAD Abdomen: soft, non-tender Groin:  no inguinal nodes palpated  Pelvic exam: Vulva:  normal female genitalia Vagina:  normal vagina Cervix:  Non-tender, Negative CMT, no lesions or redness. Uterus:  normal shape, position and consistency   Procedure:  Speculum reinserted.  Cervix visualized and cleansed with Betadine x 3.  Paracervical block was not placed.  Single toothed tenaculum applied to anterior lip of cervix without difficulty.  Uterus sounded to 8cm.  IUD package was opened.  Mirena IUD and introducer passed to fundus and then withdrawn slightly before IUD was passed into endometrial cavity.  Introducer removed.  Strings cut to 2cm.  Tenaculum removed from cervix.  Minimal bleeding noted.  Pt tolerated the procedure well.  All instruments removed from vagina.  A: Insertion of Mirena IUD Contraception desires Mid-cycle pain  P:  Return for recheck 6-8 weeks Rx for Tramadol 50mg  1-2 every 6 hours as needed.  #30/2RF given Pt aware to call for any concerns Pt aware removal due no later than 06/25/2022.  IUD card given to pt.   IUD inserted under U/S guidance - in proper position following procedure. [facility]

## 2017-06-27 ENCOUNTER — Encounter: Payer: Self-pay | Admitting: Obstetrics & Gynecology

## 2017-07-18 ENCOUNTER — Encounter: Payer: Self-pay | Admitting: Obstetrics & Gynecology

## 2017-07-18 ENCOUNTER — Ambulatory Visit (INDEPENDENT_AMBULATORY_CARE_PROVIDER_SITE_OTHER): Payer: Managed Care, Other (non HMO) | Admitting: Obstetrics & Gynecology

## 2017-07-18 ENCOUNTER — Telehealth: Payer: Self-pay | Admitting: Obstetrics & Gynecology

## 2017-07-18 VITALS — BP 110/60 | HR 84 | Resp 14 | Wt 147.5 lb

## 2017-07-18 DIAGNOSIS — G43109 Migraine with aura, not intractable, without status migrainosus: Secondary | ICD-10-CM

## 2017-07-18 DIAGNOSIS — Z30432 Encounter for removal of intrauterine contraceptive device: Secondary | ICD-10-CM | POA: Diagnosis not present

## 2017-07-18 DIAGNOSIS — F419 Anxiety disorder, unspecified: Secondary | ICD-10-CM

## 2017-07-18 NOTE — Progress Notes (Signed)
GYNECOLOGY  VISIT   HPI: 46 y.o. [id number] Married Caucasian female here for follow-up after having IUD placed 06/25/17.  Pt reports she had a migraine with an aura on July 27th.  She initially treated this with Imitrex.  This didn't help.  Excedrin has helped some.  Headache is improved the last couple of days.    Reports her anxiety and mood swings were significant the first week.  She has felt panicky at times.    Reports some right mid abdominal pain with some back pain.    She feels all of this is IUD related.  Thinks she wants to have it removed but not sure, initially.  D/w pt if this is all progesterone related, then the progesterone level will slowly decrease.  She does not think she can handle it longer so after some discussion is clearly desirous of removal.  GYNECOLOGIC HISTORY: Patient's last menstrual period was 07/08/2017. Contraception: IUD  Patient Active Problem List   Diagnosis Date Noted  . IUD (intrauterine device) in place 06/27/2017  . Chronic pelvic pain in female 04/24/2017  . Anxiety, generalized 07/05/2015  . 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 Endocervical polyp 4/07   benign  . Migraine    without aura    Past Surgical History:  Procedure Laterality Date  . DIAGNOSTIC LAPAROSCOPY  5/12   secondary to pain/negative  . 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 SHOULDER SURGERY Right 09/2016  . TONSILLECTOMY AND ADENOIDECTOMY      MEDS:  Current Outpatient Prescriptions on File Prior to Visit  Medication Sig Dispense Refill  . Calcium-Magnesium-Vitamin D (CALCIUM 500 PO) Take by mouth daily.    . cetirizine (ZYRTEC) 10 MG tablet Take 10 mg by mouth as needed.     Marland Kitchen ibuprofen (ADVIL,MOTRIN) 800 MG tablet Take 800 mg by mouth 3 (three) times daily as needed.  2  . SUMAtriptan (IMITREX) 100 MG tablet Take 1 tablet (100 mg total) by mouth  every 2 (two) hours as needed for migraine. May repeat in 2 hours if headache persists or recurs. 10 tablet 1  . traMADol (ULTRAM) 50 MG tablet Take 1-2 tablets (50-100 mg total) by mouth every 6 (six) hours as needed. for pain 30 tablet 2   No current facility-administered medications on file prior to visit.      ALLERGIES: Clarithromycin  Family History  Problem Relation Age of Onset  . Hypertension Father   . Thyroid disease Mother        thyroidectomy  . Osteoporosis Mother   . Osteoporosis Maternal Grandmother   . Breast cancer Paternal Aunt 60  . Ovarian cancer Paternal Aunt     SH:  Married, non smoker  Review of Systems  Respiratory: Negative.   Cardiovascular: Negative.   Genitourinary: Negative.   Neurological: Positive for headaches.  Psychiatric/Behavioral: The patient is nervous/anxious.     PHYSICAL EXAMINATION:    BP 110/60 (BP Location: Right Arm, Patient Position: Sitting, Cuff Size: Normal)   Pulse 84   Resp 14   Wt 147 lb 8 oz (66.9 kg)   LMP 07/08/2017   BMI 21.47 kg/m     General appearance: alert, cooperative and appears stated age Abdomen: soft, non-tender; bowel sounds normal; no masses,  no organomegaly  Pelvic: External genitalia:  no lesions              Urethra:  normal appearing urethra with no masses, tenderness or lesions              Bartholins and Skenes: normal                 Vagina: normal appearing vagina with normal color and discharge, no lesions              Cervix: no lesions and 2cm IUD string noted              Bimanual Exam:  Uterus:  normal size, contour, position, consistency, mobility, non-tender              Adnexa: no mass, fullness, tenderness   Speculum placed.  IUD string noted and grasped with ringed forceps.  IUD removed with one pull.  Chaperone was present for exam.  Assessment: Increased headaches/migraine, mood swings and anxiety since Mirena IUD placement.  Desires removal today.  Plan: IUD was  removed without difficulty.  Pt is going to call and give update on Monday.  If headache still persistent, may need her to see neurology or urgent care.  Precautions given for pt seeing urgent care over the weekend if needed.  Voices understanding.  ~15 minutes spent in face to face discussion before deciding about IUD removal.  Procedure performed after discussion.

## 2017-07-18 NOTE — Telephone Encounter (Signed)
Late entry. Spoke with patient who states that since having Mirena IUD placed on 06/25/17 she has been having ongoing daily headaches that are relieved with OTC pain medication. Headaches are causing trouble sleeping. Has had lower back pain for 1 week. Woke up today and now has lower right sided pain. Advised she will need to be seen in office for evaluation. Appointment scheduled for today 07/18/17 at 2:15 pm with Dr.Miller. Patient is agreeable to date and time.  Routing to provider for final review. Patient agreeable to disposition. Will close encounter.

## 2017-07-18 NOTE — Telephone Encounter (Signed)
Patient is having problems with the mirena and would like to speak with nurse about possible removal.

## 2017-08-01 ENCOUNTER — Encounter: Payer: Self-pay | Admitting: Obstetrics & Gynecology

## 2017-08-13 ENCOUNTER — Encounter: Payer: Self-pay | Admitting: Obstetrics & Gynecology

## 2017-08-26 ENCOUNTER — Ambulatory Visit: Payer: Managed Care, Other (non HMO) | Admitting: Obstetrics & Gynecology

## 2018-02-25 ENCOUNTER — Telehealth: Payer: Self-pay | Admitting: Obstetrics & Gynecology

## 2018-02-25 NOTE — Telephone Encounter (Signed)
Patient is having irregular bleeding and right ovary pain.

## 2018-02-25 NOTE — Telephone Encounter (Signed)
Spoke with patient. Reports irregular menses since 01/2018, bleeding q2 wks, flow normal to light. Spotting this morning, has resolved. Reports right sided "ovary" cramping started this morning, has progressed throughout day. 5/10 on pain scale, has not taken any medication. SA "rarely, no chance of pregnancy". No contraceptive.   Denies fever/chills, N/V.  1. OV scheduled for 02/26/18 at 10:45am  2. ER precautions provided.   Advised Dr. Hyacinth MeekerMiller will review, our office will return call with any additional recommendations. Patient verbalizes understanding.   Routing to provider for final review. Patient is agreeable to disposition. Will close encounter.

## 2018-02-26 ENCOUNTER — Encounter: Payer: Self-pay | Admitting: Obstetrics & Gynecology

## 2018-02-26 ENCOUNTER — Ambulatory Visit (INDEPENDENT_AMBULATORY_CARE_PROVIDER_SITE_OTHER): Payer: 59 | Admitting: Obstetrics & Gynecology

## 2018-02-26 ENCOUNTER — Other Ambulatory Visit (HOSPITAL_COMMUNITY)
Admission: RE | Admit: 2018-02-26 | Discharge: 2018-02-26 | Disposition: A | Discharge status: Home / Self Care | Payer: PRIVATE HEALTH INSURANCE | Source: Ambulatory Visit | Attending: Obstetrics & Gynecology | Admitting: Obstetrics & Gynecology

## 2018-02-26 VITALS — BP 120/58 | HR 92 | Resp 16 | Ht 69.5 in | Wt 147.0 lb

## 2018-02-26 DIAGNOSIS — N841 Polyp of cervix uteri: Secondary | ICD-10-CM | POA: Diagnosis not present

## 2018-02-26 DIAGNOSIS — Z124 Encounter for screening for malignant neoplasm of cervix: Secondary | ICD-10-CM | POA: Insufficient documentation

## 2018-02-26 DIAGNOSIS — N926 Irregular menstruation, unspecified: Secondary | ICD-10-CM | POA: Diagnosis not present

## 2018-02-26 LAB — POCT URINE PREGNANCY: Preg Test, Ur: NEGATIVE

## 2018-02-26 MED ORDER — TRAMADOL HCL 50 MG PO TABS: 50.0000 mg | ORAL_TABLET | Freq: Four times a day (QID) | ORAL | 0 refills | Status: DC | PRN

## 2018-02-26 NOTE — Progress Notes (Signed)
GYNECOLOGY  VISIT  CC:   RLQ pain  HPI: 47 y.o. [id number] Married Caucasian female here for RLQ pain x 2 days.  Since February, bleeding has been abnormal.  Normal cycle started 2/17 and lasted 5 days.  Then 3/4, had a regular 5 day bleeding episode again.  Then 3/17, she had another bleeding episode that was much lighter and lasted only 3 days.    Yesterday, there was again light spotting.  She did not bleed overnight.  Then the lower right side started and she describes this as a strong cramp.  Took Tramadol for the pain.  This was 50mg  dosage.  Pain significantly improved and then worsened again around 9pm.  Hasn't needed anything else for pain.   Had right sided shoulder pain that has improved.  Was contemplating surgery but this is on hold with improvement with pain in shoulder.  Sees [facility] Criss AlvineLauffenberger, at [facility].    GYNECOLOGIC HISTORY: Patient's last menstrual period was 02/16/2018. Contraception: none Menopausal hormone therapy: none  Patient Active Problem List   Diagnosis Date Noted  . IUD (intrauterine device) in place 06/27/2017  . Chronic pelvic pain in female 04/24/2017  . Anxiety, generalized 07/05/2015  . 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 Endocervical polyp 4/07   benign  . Migraine    without aura    Past Surgical History:  Procedure Laterality Date  . DIAGNOSTIC LAPAROSCOPY  5/12   secondary to pain/negative  . 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 SHOULDER SURGERY Right 09/2016  . TONSILLECTOMY AND ADENOIDECTOMY      MEDS:   Current Outpatient Medications on File Prior to Visit  Medication Sig Dispense Refill  . Calcium-Magnesium-Vitamin D (CALCIUM 500 PO) Take by mouth daily.    . Multiple Vitamin (MULTIVITAMIN) tablet Take 1 tablet by mouth daily.    . traMADol (ULTRAM) 50 MG tablet Take 1-2 tablets (50-100 mg total) by mouth  every 6 (six) hours as needed. for pain 30 tablet 2   No current facility-administered medications on file prior to visit.     ALLERGIES: Clarithromycin  Family History  Problem Relation Age of Onset  . Hypertension Father   . Thyroid disease Mother        thyroidectomy  . Osteoporosis Mother   . Osteoporosis Maternal Grandmother   . Breast cancer Paternal Aunt [id number]  . Ovarian cancer Paternal Aunt     SH:  Married, non smoker  Review of Systems  Gastrointestinal: Positive for abdominal pain.  Genitourinary:       Menstrual cycle changes Unscheduled bleeding   All other systems reviewed and are negative.   PHYSICAL EXAMINATION:    BP (!) 120/58 (BP Location: Right Arm, Patient Position: Sitting, Cuff Size: Normal)   Pulse 92   Resp 16   Ht 5' 9.5" (1.765 m)   Wt 147 lb (66.7 kg)   LMP 02/16/2018   BMI 21.40 kg/m     General appearance: alert, cooperative and appears stated age CV:  Regular rate and rhythm Lungs:  clear to auscultation, no wheezes, rales or rhonchi, symmetric air entry Abdomen: soft, mild RLQ tenderness, no acute abdomen; bowel sounds normal; no masses, no organomegaly  Pelvic: External genitalia:  no lesions              Urethra:  normal appearing urethra with no masses, tenderness or lesions  Bartholins and Skenes: normal                 Vagina: normal appearing vagina with normal color and discharge, no lesions              Cervix: no lesions and polyp present              Bimanual Exam:  Uterus:  normal size, contour, position, consistency, mobility, non-tender              Adnexa: no mass, fullness, tenderness   Procedure:  Recommended removal of polyp due to irregular bleeding.  Pap was obtained prior to this.  Polyp was then grasped with ringed forceps and removed by twisting at the base.  Minimal bleeding noted after removal.  Pt tolerated procedure well.  Chaperone was present for exam.  Assessment: RLQ pain, recurrent  problem Irregular bleeding Cervical polyp noted on exam today and removed  Plan: Cervical polyp sent to pathology Pap with HR HPV obtained today Tramadol 50-100mg  q 6 hours.  #30/0RF If pain persists into the weekend, she will call on Monday for ultrasound.

## 2018-02-28 LAB — CYTOLOGY - PAP
DIAGNOSIS: NEGATIVE
HPV (WINDOPATH): NOT DETECTED

## 2018-03-03 ENCOUNTER — Telehealth: Payer: Self-pay

## 2018-03-03 NOTE — Telephone Encounter (Signed)
Was there no sooner appt?  Just wondering.

## 2018-03-03 NOTE — Telephone Encounter (Signed)
-----   Message from Jerene BearsMary S Miller, MD sent at 03/01/2018 11:00 PM EDT ----- Please let pt know her pap was negative and HR HPV was negative.  Cervical polyp pathology was negative as well.  She and I discussed proceeding with PUS if still having pain.  Ok to schedule if that is the case.  Thanks.  CC:  Gara Kronereina Morales

## 2018-03-03 NOTE — Telephone Encounter (Signed)
Spoke with patient. Advised of results as seen below from Dr.Miller. Patient verbalizes understanding. Patient states that she is still having intermittent cramping pain and spotting. Advised will need to be seen for PUS. Appointment scheduled for 03/11/2018 at 1:30 pm with 2 pm consult with Dr.Miller. Patient verbalizes understanding. Aware if pain worsens or develops new symptoms will need to be seen for further evaluation sooner. Patient verbalizes understanding.   Routing to provider for final review. Patient agreeable to disposition. Will close encounter.

## 2018-03-04 NOTE — Telephone Encounter (Signed)
Ultrasound is full for 4/4. Did advise patien that imaging could be done at an outside facility if she would like or if symptoms worsen. Would you like her to be seen with [facility] for earlier scan?

## 2018-03-04 NOTE — Telephone Encounter (Signed)
She is hurting right now so PUS may be more helpful if done right now.  Can you see if she'd be willing to do at [facility]?  Thanks.

## 2018-03-04 NOTE — Telephone Encounter (Signed)
Spoke with patient. Patient states pain has decreased and is coming less often. Does not wish to proceed with imaging earlier at [facility] at this time. Would like to monitor symptoms and will return call if she would like to have [facility] done earlier at [facility].

## 2018-03-10 ENCOUNTER — Other Ambulatory Visit: Payer: Self-pay

## 2018-03-10 DIAGNOSIS — N926 Irregular menstruation, unspecified: Secondary | ICD-10-CM

## 2018-03-11 ENCOUNTER — Encounter: Payer: Self-pay | Admitting: Obstetrics & Gynecology

## 2018-03-11 ENCOUNTER — Ambulatory Visit (INDEPENDENT_AMBULATORY_CARE_PROVIDER_SITE_OTHER): Payer: 59 | Admitting: Obstetrics & Gynecology

## 2018-03-11 ENCOUNTER — Ambulatory Visit (INDEPENDENT_AMBULATORY_CARE_PROVIDER_SITE_OTHER): Payer: 59

## 2018-03-11 VITALS — BP 96/60 | HR 80 | Resp 14

## 2018-03-11 DIAGNOSIS — N926 Irregular menstruation, unspecified: Secondary | ICD-10-CM

## 2018-03-11 DIAGNOSIS — R1031 Right lower quadrant pain: Secondary | ICD-10-CM | POA: Diagnosis not present

## 2018-03-11 MED ORDER — TRAMADOL HCL 50 MG PO TABS: 50.0000 mg | ORAL_TABLET | Freq: Four times a day (QID) | ORAL | 0 refills | Status: DC | PRN

## 2018-03-11 NOTE — Addendum Note (Signed)
Addended by: Jerene BearsMILLER, Roselin Wiemann S on: 03/11/2018 02:35 PM   Modules accepted: Orders

## 2018-03-11 NOTE — Progress Notes (Addendum)
GYNECOLOGY  VISIT   CC:   RLQ  HPI: 47 y.o. [id number] Married Caucasian female here for complaint of RLQ pain and dysmenorrhea with this last cycle.  Flow started on started on Saturday and was very painful.  Took Tramadol and Excedrin regularly and she reports it would wear off before the next dosage was due.  She did have nausea associated with the pain.  Did not have any GI changes with this. Improved on Sunday and then again yesterday and today.  Pain is better but is achy today.  Reports this was the worse cycle "ever".  Has considered surgery but reports she cannot tolerate cycles like this regularly.    Ultrasound: Uterus: 7.6 x 5.4 x 3.8cm Endometrium:  1.463mm Left ovary:  3.0 x 1.7 x 2.1cm Right ovary:  3.3 x  3.3 x 2.9 with 2.8 x 2.0cm Cul de sac:  No free fluid  GYNECOLOGIC HISTORY: LMP:  03/08/18 Contraception: condoms  Patient Active Problem List   Diagnosis Date Noted  . Chronic pelvic pain in female 04/24/2017  . Anxiety, generalized 07/05/2015  . 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 Endocervical polyp 4/07   benign  . Migraine    without aura    Past Surgical History:  Procedure Laterality Date  . DIAGNOSTIC LAPAROSCOPY  5/12   secondary to pain/negative  . 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 SHOULDER SURGERY Right 09/2016  . TONSILLECTOMY AND ADENOIDECTOMY      MEDS:   Current Outpatient Medications on File Prior to Visit  Medication Sig Dispense Refill  . Calcium-Magnesium-Vitamin D (CALCIUM 500 PO) Take by mouth daily.    . Multiple Vitamin (MULTIVITAMIN) tablet Take 1 tablet by mouth daily.    . traMADol (ULTRAM) 50 MG tablet Take 1-2 tablets (50-100 mg total) by mouth every 6 (six) hours as needed. 30 tablet 0   No current facility-administered medications on file prior to visit.     ALLERGIES: Clarithromycin  Family History    Problem Relation Age of Onset  . Hypertension Father   . Thyroid disease Mother        thyroidectomy  . Osteoporosis Mother   . Osteoporosis Maternal Grandmother   . Breast cancer Paternal Aunt [id number]  . Ovarian cancer Paternal Aunt     SH:  Former smoker, married  Review of Systems  Genitourinary:       Right lower quadrant pain    PHYSICAL EXAMINATION:    BP 96/60 (BP Location: Right Arm, Patient Position: Sitting, Cuff Size: Normal)   Pulse 80   Resp 14   LMP 02/05/2018     General appearance: alert, cooperative and appears stated age   Assessment: RLQ pain Ovarian cysts/follicles on ultrasound  Plan: Pt does not want to be hormonal therapy.  Failed Mirena IUD use. RF for Tramadol 50-100mg  every 6 hours.  #30/0RF. Considering definitive surgery.  Procedure, risks, recovery all discussed.  Pt wants to consider.   ~30 minutes spent with patient >50% of time was in face to face discussion of above.

## 2018-03-31 ENCOUNTER — Other Ambulatory Visit: Payer: Self-pay | Admitting: Obstetrics & Gynecology

## 2018-03-31 NOTE — Telephone Encounter (Signed)
Medication refill request: Tramadol  #30 Last AEX:  Last OV 03-11-18 Next AEX: 06-02-18 Last MMG (if hormonal medication request): 01-24-16 [id number] Refill authorized: Please advise

## 2018-04-02 ENCOUNTER — Other Ambulatory Visit: Payer: Self-pay | Admitting: Obstetrics & Gynecology

## 2018-04-02 ENCOUNTER — Encounter: Payer: Self-pay | Admitting: Obstetrics & Gynecology

## 2018-04-02 NOTE — Telephone Encounter (Signed)
To Dr. Hyacinth Meeker to review this prescription refill.

## 2018-04-02 NOTE — Telephone Encounter (Signed)
Patient sent the following correspondence through MyChart. Routing to triage to assist patient with request.  ----- Message from Mychart, Generic sent at 04/02/2018 1:57 PM EDT -----    Hey Dr. Hyacinth Meeker,    I submitted a request for a refill of Tramadol through [facility] and also through here. I hadn't heard anything and I wasn't sure which way is preferred. Just wanted to let you know it was submitted twice. Just an update...my pain from the cysts eventually tapered off and now the pain and nausea seem to be building back up. I should be getting my period in the next few days. I'm really hoping it won't be as severe as the last one, but I'm a little scared and want to be prepared for it just in case :)    Thank you!  Germaine   Last seen: 03/11/18

## 2018-04-03 ENCOUNTER — Ambulatory Visit (INDEPENDENT_AMBULATORY_CARE_PROVIDER_SITE_OTHER): Payer: 59 | Admitting: Obstetrics & Gynecology

## 2018-04-03 ENCOUNTER — Other Ambulatory Visit: Payer: Self-pay

## 2018-04-03 ENCOUNTER — Encounter: Payer: Self-pay | Admitting: Obstetrics & Gynecology

## 2018-04-03 ENCOUNTER — Telehealth: Payer: Self-pay | Admitting: Obstetrics & Gynecology

## 2018-04-03 VITALS — BP 96/60 | HR 88 | Resp 16 | Ht 69.5 in | Wt 142.0 lb

## 2018-04-03 DIAGNOSIS — R1031 Right lower quadrant pain: Secondary | ICD-10-CM

## 2018-04-03 MED ORDER — TRAMADOL HCL 50 MG PO TABS: ORAL_TABLET | ORAL | 0 refills | Status: DC

## 2018-04-03 NOTE — Telephone Encounter (Signed)
Pt sent mychart message and I responded.  She needs OV for refill.  Medication declined.

## 2018-04-03 NOTE — Telephone Encounter (Signed)
Patient returning Jill's call.  °

## 2018-04-03 NOTE — Telephone Encounter (Signed)
Left message to call Gabby Rackers at [phone].  

## 2018-04-03 NOTE — Progress Notes (Signed)
GYNECOLOGY  VISIT  CC:   RLQ pain  HPI: 47 y.o. G48P0012 Married Caucasian female here for painful periods and ovarian cysts.  Has been using Tramadol for this.  Requested pain medication today.  Pt reports she did use entire prescription last month.  Pt is aware this is a lot of Tramadol.  Excedrin does help and she sometimes uses this with the Tramadol.  Is not in pain right now but is worried that her next cycle is going to be severe.  She and spouse discussed surgical options.  She has additional questions today.  These were all addressed.    GYNECOLOGIC HISTORY: Patient's last menstrual period was 03/08/2018. Contraception: condoms  Menopausal hormone therapy: none  Patient Active Problem List   Diagnosis Date Noted  . Chronic pelvic pain in female 04/24/2017  . Anxiety, generalized 07/05/2015  . 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 Endocervical polyp 4/07   benign  . Migraine    without aura    Past Surgical History:  Procedure Laterality Date  . DIAGNOSTIC LAPAROSCOPY  5/12   secondary to pain/negative  . 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 SHOULDER SURGERY Right 09/2016  . TONSILLECTOMY AND ADENOIDECTOMY      MEDS:   Current Outpatient Medications on File Prior to Visit  Medication Sig Dispense Refill  . Calcium-Magnesium-Vitamin D (CALCIUM 500 PO) Take by mouth daily.    . Multiple Vitamin (MULTIVITAMIN) tablet Take 1 tablet by mouth daily.    . traMADol (ULTRAM) 50 MG tablet Take 1-2 tablets (50-100 mg total) by mouth every 6 (six) hours as needed. 30 tablet 0   No current facility-administered medications on file prior to visit.     ALLERGIES: Clarithromycin  Family History  Problem Relation Age of Onset  . Hypertension Father   . Thyroid disease Mother        thyroidectomy  . Osteoporosis Mother   . Osteoporosis Maternal Grandmother    . Breast cancer Paternal Aunt 70  . Ovarian cancer Paternal Aunt     SH:  Married, previous smoker  Review of Systems  Genitourinary:       Painful periods  Menstrual cycle changes   All other systems reviewed and are negative.   PHYSICAL EXAMINATION:    BP 96/60 (BP Location: Right Arm, Patient Position: Sitting, Cuff Size: Normal)   Pulse 88   Resp 16   Ht 5' 9.5" (1.765 m)   Wt 142 lb (64.4 kg)   LMP 03/08/2018   BMI 20.67 kg/m     General appearance: alert, cooperative and appears stated age No exam performed  Chaperone was present for exam.  Assessment: Chronic RLQ pain  Plan: Rx for tramadol  q 6 hrs for 5 days dosing.  Pt aware she needs to be seen for refills.  She is considering additional options for treatment including RSO or TLH.  Questions answered.   ~15 minutes spent with patient >50% of time was in face to face discussion of above.

## 2018-04-03 NOTE — Telephone Encounter (Signed)
Spoke with patient. OV scheduled for today at 3:30pm with Dr. Hyacinth Meeker to discuss alternative tx for painful menses.   Routing to provider for final review. Patient is agreeable to disposition. Will close encounter.

## 2018-04-03 NOTE — Telephone Encounter (Signed)
Patient sent the following correspondence through MyChart. Routing to triage to assist patient with request.  ----- Message from Mychart, Generic sent at 04/03/2018 8:48 AM EDT -----    Ok no problem. I've been under the impression that they are safe and it was okay to take them, but I'm totally open to suggestions for something else that will help. It went to a whole new level last month with the pain and multiple cysts, but I can't jump into surgery without seeing if this is going to be the case each month. I definitely had to take more last month, because it was much longer process than normal. Of course, I did not take them more than prescribed. I do have some left, but I know it's not going to cover it if it's as bad and as long as last time. If there is something else you have in mind, that won't cause so much of an issue, please let me know. I know there are new rules and if they are causing some sort of damage, I don't want to take them. Sorry for the misunderstanding.  I won't request anymore refills.  Thanks!  ----- Message -----  From: Jerene Bears, MD  Sent: 04/03/2018 8:29 AM EDT  To: [name] [name]  Subject: RE: Non-Urgent Medical Question  [name] [name],  I gave you a prescription for 30 of these on 4/9 and you said you still had some from a previous provider so I need you to know this is a lot of Tramadol. I really think you need to be thinking about something else for treatment of this. I told you previsouly that you did not need to come in for refills for this but due to the changes with the Stop Act Law in 2017, you do need to come in for refills. I am sorry about this but that is the case. Thanks.    Dr. Hyacinth Meeker    ----- Message -----   From: [name] [name]   Sent: 04/02/2018 1:57 PM EDT    To: Jerene Bears, MD  Subject: Non-Urgent Medical Question    Hey Dr. Hyacinth Meeker,    I submitted a request for a refill of Tramadol through [facility] and also through here. I  hadn't heard anything and I wasn't sure which way is preferred. Just wanted to let you know it was submitted twice. Just an update...my pain from the cysts eventually tapered off and now the pain and nausea seem to be building back up. I should be getting my period in the next few days. I'm really hoping it won't be as severe as the last one, but I'm a little scared and want to be prepared for it just in case :)    Thank you!  [name] [name]

## 2018-05-30 ENCOUNTER — Telehealth: Payer: Self-pay | Admitting: Obstetrics & Gynecology

## 2018-05-30 NOTE — Progress Notes (Deleted)
47 y.o. [id number] Married{Race/ethnicity:17218}F here for annual exam.    No LMP recorded.          Sexually active: {yes no:314532}  The current method of family planning is {contraception:315051}.    Exercising: {yes no:314532}  {types:19826} Smoker:  {YES NO:22349}  Health Maintenance: Pap:  02-16-15 ASCUS HPV HR neg, 02-26-18 neg HPV HR neg History of abnormal Pap:  {YES NO:22349} MMG:  01-24-16 neg Colonoscopy:  none BMD:   none TDaP:  2013 Pneumonia vaccine(s):  *** Shingrix:   *** Hep C testing: *** Screening Labs: ***, Hb today: ***, Urine today: ***   reports that she quit smoking about 2 years ago. Her smoking use included e-cigarettes. She smoked 0.50 packs per day. She has never used smokeless tobacco. She reports that she does not drink alcohol or use drugs.  Past Medical History:  Diagnosis Date  . Endocervical polyp 4/07   benign  . Migraine    without aura    Past Surgical History:  Procedure Laterality Date  . DIAGNOSTIC LAPAROSCOPY  5/12   secondary to pain/negative  . 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 SHOULDER SURGERY Right 09/2016  . TONSILLECTOMY AND ADENOIDECTOMY      Current Outpatient Medications  Medication Sig Dispense Refill  . Calcium-Magnesium-Vitamin D (CALCIUM 500 PO) Take by mouth daily.    . Multiple Vitamin (MULTIVITAMIN) tablet Take 1 tablet by mouth daily.    . traMADol (ULTRAM) 50 MG tablet 100mg  (two tab) every 6 hours as needed for pain 40 tablet 0   No current facility-administered medications for this visit.     Family History  Problem Relation Age of Onset  . Hypertension Father   . Thyroid disease Mother        thyroidectomy  . Osteoporosis Mother   . Osteoporosis Maternal Grandmother   . Breast cancer Paternal Aunt [id number]  . Ovarian cancer Paternal Aunt     ROS  Exam:   There were no vitals taken for this visit.  Height:      Ht Readings from Last 3 Encounters:   04/03/18 5' 9.5" (1.765 m)  02/26/18 5' 9.5" (1.765 m)  06/14/17 5' 9.5" (1.765 m)    General appearance: alert, cooperative and appears stated age Head: Normocephalic, without obvious abnormality, atraumatic Neck: no adenopathy, supple, symmetrical, trachea midline and thyroid {EXAM; THYROID:18604} Lungs: clear to auscultation bilaterally Breasts: {Exam; breast:13139::"normal appearance, no masses or tenderness"} Heart: regular rate and rhythm Abdomen: soft, non-tender; bowel sounds normal; no masses,  no organomegaly Extremities: extremities normal, atraumatic, no cyanosis or edema Skin: Skin color, texture, turgor normal. No rashes or lesions Lymph nodes: Cervical, supraclavicular, and axillary nodes normal. No abnormal inguinal nodes palpated Neurologic: Grossly normal   Pelvic: External genitalia:  no lesions              Urethra:  normal appearing urethra with no masses, tenderness or lesions              Bartholins and Skenes: normal                 Vagina: normal appearing vagina with normal color and discharge, no lesions              Cervix: {exam; cervix:14595}              Pap taken: {yes no:314532} Bimanual Exam:  Uterus:  {exam; uterus:12215}  Adnexa: {exam; adnexa:12223}               Rectovaginal: Confirms               Anus:  normal sphincter tone, no lesions  Chaperone was present for exam.  A:  Well Woman with normal exam  P:   {plan; gyn:5269::"mammogram","pap smear","return annually or prn"}

## 2018-05-30 NOTE — Telephone Encounter (Signed)
Lmovm to confirm insurance eligibility for appt on Monday 06/02/18.

## 2018-06-02 ENCOUNTER — Ambulatory Visit: Payer: Managed Care, Other (non HMO) | Admitting: Obstetrics & Gynecology

## 2018-08-18 DIAGNOSIS — Z808 Family history of malignant neoplasm of other organs or systems: Secondary | ICD-10-CM | POA: Insufficient documentation

## 2018-09-02 HISTORY — PX: SHOULDER SURGERY: SHX246

## 2019-10-08 ENCOUNTER — Telehealth: Payer: Self-pay | Admitting: Obstetrics & Gynecology

## 2019-10-08 NOTE — Telephone Encounter (Signed)
Spoke with pt. Pt states having irregular cycles that start off with heavy bleeding by changing pads 1-3 hrs then lighter during the day. Pt had cycles 9/17, 10/12, 11/3 to current. Pt denies pain, cramping, lightheadedness and weakness. Instructed pt to go to ER if changing pads more than 1/hr or feeling weak. Pt verbalized understanding. Pt has hx of cysts and removed cyst in 2005. OV scheduled 11/6 at 11am.  Routing to provider for final review. Patient is agreeable to disposition. Will close encounter.

## 2019-10-08 NOTE — Telephone Encounter (Signed)
Patient is calling regarding irregular menses. Patient stated that she has been getting her menses about every 2 weeks. Patient stated that she is currently bleeding and that the bleeding is heavy. Patient declined cramping, weakness or chills. Patient stated that she had her menses 09/22-09/26, again 10/15-10/19 and again 11/03-current.

## 2019-10-09 ENCOUNTER — Ambulatory Visit (INDEPENDENT_AMBULATORY_CARE_PROVIDER_SITE_OTHER): Payer: 59 | Admitting: Obstetrics & Gynecology

## 2019-10-09 ENCOUNTER — Encounter: Payer: Self-pay | Admitting: Obstetrics & Gynecology

## 2019-10-09 ENCOUNTER — Other Ambulatory Visit: Payer: Self-pay

## 2019-10-09 ENCOUNTER — Other Ambulatory Visit: Payer: Self-pay | Admitting: Obstetrics & Gynecology

## 2019-10-09 VITALS — BP 120/72 | HR 84 | Temp 97.7°F | Resp 12 | Wt 149.0 lb

## 2019-10-09 DIAGNOSIS — D500 Iron deficiency anemia secondary to blood loss (chronic): Secondary | ICD-10-CM | POA: Diagnosis not present

## 2019-10-09 DIAGNOSIS — N921 Excessive and frequent menstruation with irregular cycle: Secondary | ICD-10-CM | POA: Diagnosis not present

## 2019-10-09 DIAGNOSIS — R790 Abnormal level of blood mineral: Secondary | ICD-10-CM | POA: Diagnosis not present

## 2019-10-09 MED ORDER — NORETHINDRONE ACETATE 5 MG PO TABS: ORAL_TABLET | ORAL | 0 refills | Status: DC

## 2019-10-09 NOTE — Progress Notes (Signed)
GYNECOLOGY  VISIT  CC:   Patient complains of having heavy menses that are three weeks apart. Per patient had pain in right ovary last night.   HPI: 48 y.o. G52P0012 Married White or Caucasian female here for complaint of heavy cycles and irregular bleeding over the past few months.  Typically, cycles are very heavy for two days.  She does pass clots during these two days.  Cycles then lighten and then is completed after about 4 days.  Length of flow has improved over the past few years.    She's been experiencing increased fatigue over the past three months but she's felt fatigued for the past year or more.  During the past three months, she's also had increased frequency of cycles starting 9/17, 10/12, and 11/3.  Today is day which is atypical for her cycles up until the past few months.  She had blood work done in early October.  Hb was 11.3 but ferritin was 2!!!  Pt is on oral iron.  Feel she needs iron infusions.  Will refer for this.  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  Has not been tested for Celiac disease but doesn't want to start back gluten.  Wants to know if can be tested as she's read this isn't done without gluten exposure.  Explained antibody test to her and that typically, without gluten in diet, testing is often negative.  However, a positive or negative test isn't going to cause her to alter the way she eats because she feels so much better without gluten.  Pt is comfortable without having blood work for this today.  Last pap 01/2018 that was negative and neg HR HPV.  She's had a long hx of RLQ pain that occurs mid cycle.  It is about every other month and is always on the right.  This has continued.  Did have laparoscopy 6/06 for ovarian cystectomy.  No endometriosis was noted at that time.    She stopped smoking 07/2015 and is not using e cigarettes either.    GYNECOLOGIC HISTORY: Patient's last menstrual period was 10/05/2019. Contraception: Condoms Menopausal  hormone therapy: none  Patient Active Problem List   Diagnosis Date Noted  . Chronic pelvic pain in female 04/24/2017  . Anxiety, generalized 07/05/2015  . 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 Endocervical polyp 4/07   benign  . Migraine    without aura    Past Surgical History:  Procedure Laterality Date  . DIAGNOSTIC LAPAROSCOPY  5/12   secondary to pain/negative  . 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 SHOULDER SURGERY Right 09/2016  . SHOULDER SURGERY  09/2018  . TONSILLECTOMY AND ADENOIDECTOMY      MEDS:   Current Outpatient Medications on File Prior to Visit  Medication Sig Dispense Refill  . Calcium-Magnesium-Vitamin D (CALCIUM 500 PO) Take by mouth daily.    . Cyanocobalamin (B-12 COMPLIANCE INJECTION) 1000 MCG/ML KIT     . Ferrous Sulfate Dried (FEOSOL) 200 (65 Fe) MG TABS      No current facility-administered medications on file prior to visit.     ALLERGIES: Clarithromycin  Family History  Problem Relation Age of Onset  . Hypertension Father   . Thyroid disease Mother        thyroidectomy  . Osteoporosis Mother   . Osteoporosis Maternal Grandmother   . Breast cancer Paternal Aunt 31  .  Ovarian cancer Paternal Aunt     SH:  Married, non smoker  Review of Systems  Constitutional: Negative.   HENT: Negative.   Eyes: Negative.   Respiratory: Negative.   Cardiovascular: Negative.   Gastrointestinal: Negative.   Endocrine: Negative.   Genitourinary: Positive for vaginal bleeding.       Pain in ovary  Musculoskeletal: Negative.   Skin: Negative.   Allergic/Immunologic: Negative.   Neurological: Negative.   Hematological: Negative.   Psychiatric/Behavioral: Negative.     PHYSICAL EXAMINATION:    BP 120/72 (BP Location: Right Arm, Patient Position: Sitting, Cuff Size: Normal)   Pulse 84   Temp 97.7 F (36.5 C) (Temporal)    Resp 12   Wt 149 lb (67.6 kg)   LMP 10/05/2019   BMI 21.69 kg/m     General appearance: alert, cooperative and appears stated age Abdomen: soft, non-tender; bowel sounds normal; no masses,  no organomegaly Lymph:  no inguinal LAD noted  Pelvic: External genitalia:  no lesions              Urethra:  normal appearing urethra with no masses, tenderness or lesions              Bartholins and Skenes: normal                 Vagina: normal appearing vagina with normal color and discharge, no lesions              Cervix: no lesions (actively bleeding today)              Bimanual Exam:  Uterus:  normal size, contour, position, consistency, mobility, non-tender              Adnexa: no mass, fullness, tenderness              Anus:  normal sphincter tone, no lesions  Endometrial biopsy recommended.  Discussed with patient.  Verbal and consent obtained.   Procedure:  Speculum placed.  Cervix visualized and cleansed with betadine prep.  A single toothed tenaculum was applied to the anterior lip of the cervix.  Endometrial pipelle was advanced through the cervix into the endometrial cavity without difficulty.  Pipelle passed to 8cm.  Suction applied and pipelle removed with good tissue sample obtained.  I did perform three passes due to her bleeding.  Tenculum removed.  No bleeding noted.  Patient tolerated procedure well.  Chaperone was present for exam.  Assessment: Menorrhagia with irregular bleeding Chronic RLQ pain Failed IUD use in the past Mild iron deficiency anemia Ferritin level of 2  Plan: Endometrial biopsy is pending.  She is considering surgery as she does not want to be on any hormonal therapy.  Endometrial ablation with tubal ligation/excision also discussed.  Information given.  She may need to return for PUS is decides to proceed with surgery.  Last PUS was 03/2018 and was essentially normal except for 2.8cm cyst.  Pt is going to consider options.   Referral to hematology for iron  infusions   ~25 minutes spent with patient >50% of time was in face to face discussion of above.

## 2019-10-12 ENCOUNTER — Telehealth: Payer: Self-pay | Admitting: Family

## 2019-10-12 NOTE — Telephone Encounter (Signed)
Confirmed new patient appt with patietn 11/17 at 1 pm. Pt will use MyChart for location /date/time

## 2019-10-19 ENCOUNTER — Other Ambulatory Visit: Payer: Self-pay | Admitting: Family

## 2019-10-19 DIAGNOSIS — D649 Anemia, unspecified: Secondary | ICD-10-CM

## 2019-10-20 ENCOUNTER — Encounter: Payer: Self-pay | Admitting: Family

## 2019-10-20 ENCOUNTER — Other Ambulatory Visit: Payer: Self-pay

## 2019-10-20 ENCOUNTER — Inpatient Hospital Stay: Payer: 59

## 2019-10-20 ENCOUNTER — Inpatient Hospital Stay: Payer: 59 | Attending: Family | Admitting: Family

## 2019-10-20 VITALS — BP 110/63 | HR 69 | Temp 97.5°F | Resp 17 | Wt 150.0 lb

## 2019-10-20 DIAGNOSIS — D500 Iron deficiency anemia secondary to blood loss (chronic): Secondary | ICD-10-CM

## 2019-10-20 DIAGNOSIS — D649 Anemia, unspecified: Secondary | ICD-10-CM

## 2019-10-20 DIAGNOSIS — N920 Excessive and frequent menstruation with regular cycle: Secondary | ICD-10-CM

## 2019-10-20 LAB — CBC WITH DIFFERENTIAL (CANCER CENTER ONLY)
Abs Immature Granulocytes: 0.07 10*3/uL (ref 0.00–0.07)
Basophils Absolute: 0 10*3/uL (ref 0.0–0.1)
Basophils Relative: 0 %
Eosinophils Absolute: 0.1 10*3/uL (ref 0.0–0.5)
Eosinophils Relative: 1 %
HCT: 39.2 % (ref 36.0–46.0)
Hemoglobin: 12.2 g/dL (ref 12.0–15.0)
Immature Granulocytes: 1 %
Lymphocytes Relative: 20 %
Lymphs Abs: 1.6 10*3/uL (ref 0.7–4.0)
MCH: 26.6 pg (ref 26.0–34.0)
MCHC: 31.1 g/dL (ref 30.0–36.0)
MCV: 85.6 fL (ref 80.0–100.0)
Monocytes Absolute: 0.4 10*3/uL (ref 0.1–1.0)
Monocytes Relative: 5 %
Neutro Abs: 5.9 10*3/uL (ref 1.7–7.7)
Neutrophils Relative %: 73 %
Platelet Count: 279 10*3/uL (ref 150–400)
RBC: 4.58 MIL/uL (ref 3.87–5.11)
RDW: 17.8 % — ABNORMAL HIGH (ref 11.5–15.5)
WBC Count: 8 10*3/uL (ref 4.0–10.5)
nRBC: 0 % (ref 0.0–0.2)

## 2019-10-20 LAB — CMP (CANCER CENTER ONLY)
ALT: 19 U/L (ref 0–44)
AST: 23 U/L (ref 15–41)
Albumin: 4.6 g/dL (ref 3.5–5.0)
Alkaline Phosphatase: 59 U/L (ref 38–126)
Anion gap: 8 (ref 5–15)
BUN: 8 mg/dL (ref 6–20)
CO2: 29 mmol/L (ref 22–32)
Calcium: 9.9 mg/dL (ref 8.9–10.3)
Chloride: 105 mmol/L (ref 98–111)
Creatinine: 0.76 mg/dL (ref 0.44–1.00)
GFR, Est AFR Am: >60 mL/min (ref >60)
GFR, Estimated: >60 mL/min (ref >60)
Glucose, Bld: 99 mg/dL (ref 70–99)
Potassium: 4.2 mmol/L (ref 3.5–5.1)
Sodium: 142 mmol/L (ref 135–145)
Total Bilirubin: 0.3 mg/dL (ref 0.3–1.2)
Total Protein: 6.6 g/dL (ref 6.5–8.1)

## 2019-10-20 LAB — RETICULOCYTES
Immature Retic Fract: 6.7 % (ref 2.3–15.9)
RBC.: 4.56 MIL/uL (ref 3.87–5.11)
Retic Count, Absolute: 47.9 10*3/uL (ref 19.0–186.0)
Retic Ct Pct: 1.1 % (ref 0.4–3.1)

## 2019-10-20 LAB — LACTATE DEHYDROGENASE: LDH: 141 U/L (ref 98–192)

## 2019-10-20 LAB — SAVE SMEAR(SSMR), FOR PROVIDER SLIDE REVIEW

## 2019-10-20 NOTE — Progress Notes (Signed)
Hematology/Oncology Consultation   Name: [name] [name]      MRN: [id number]    Location: Room/bed info not found  Date: 10/20/2019 Time:1:35 PM   REFERRING PHYSICIAN: Megan Salon, MD  REASON FOR CONSULT: Iron deficiency secondary to heavy cycle    DIAGNOSIS: Iron deficiency anemia secondary to heavy cycle   HISTORY OF PRESENT ILLNESS: [name] [name] is a very pleasant 48 yo caucasian female with iron deficiency anemia secondary to heavy irregular cycles.  She states that she had issues with iron deficiency in her 20's and again during her pregnancy with her son and took oral iron.  In October, her ferritin was 2 and iron saturation 5%. Hgb was 11.3 and MCV 77.6. Hgb today is 12.2. and MCV 85.6.  She is currently on oral iron, Feosol, once a day and tolerating well.  She also takes vitamin D daily and gets B 12 injections.  She is still having some fatigue, SOB with stairs, palpitations and popcorn cravings.  She has discussed options with her gynecologist on how to treat the bleeding. She is considering a partial hysterectomy (right ovary removal) next year. She is unable to have surgery at this time due to using up her FMLA earlier this year for shoulder surgery.  She is not on any birth control at this time.  She has 1 son and no history of miscarriage.  No family history of anemia that she is aware of.  No personal history of cancer.  Family history of cancer includes: maternal aunt - metastatic ovarian, paternal aunt - breast and mom with possible thyroid.  She has had a right ovarian cyst removed, tonsillectomy and 2 shoulder surgeries without any complications.  No thyroid disease or diabetes.  No fever, chills, n/v, cough, rash, dizziness, chest pain, abdominal pain or changes in bowel or bladder habits.  No swelling, tenderness, numbness or tingling in her extremities.  No falls or syncopal episodes to report.  She quit smoking 7 years ago, 1ppd.  She will occasionally have  a glass of wine. No recreational drugs.  She has a good appetite and is staying well hydrated. Her weight is described as stable.  She does eat meat but is gluten free.  She is active and normally works out regularly on her treadmill. It has been hard recently due to the fatigue and SOB with exertion.   ROS: All other 10 point review of systems is negative.   PAST MEDICAL HISTORY:   Past Medical History:  Diagnosis Date  . Endocervical polyp 4/07   benign  . Migraine    without aura    ALLERGIES: Allergies  Allergen Reactions  . Clarithromycin     REACTION: upsets stomach      MEDICATIONS:  Current Outpatient Medications on File Prior to Visit  Medication Sig Dispense Refill  . Calcium-Magnesium-Vitamin D (CALCIUM 500 PO) Take by mouth daily.    . Cyanocobalamin (B-12 COMPLIANCE INJECTION) 1000 MCG/ML KIT     . Ferrous Sulfate Dried (FEOSOL) 200 (65 Fe) MG TABS     . norethindrone (AYGESTIN) 5 MG tablet 1 tab TID until bleeding stops.  Decrease to twice daily x 2 days and daily. 60 tablet 0   No current facility-administered medications on file prior to visit.      PAST SURGICAL HISTORY Past Surgical History:  Procedure Laterality Date  . DIAGNOSTIC LAPAROSCOPY  5/12   secondary to pain/negative  . 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 SHOULDER SURGERY Right 09/2016  . SHOULDER SURGERY Right 09/2018  . TONSILLECTOMY AND ADENOIDECTOMY      FAMILY HISTORY: Family History  Problem Relation Age of Onset  . Hypertension Father   . Thyroid disease Mother        thyroidectomy  . Osteoporosis Mother   . Osteoporosis Maternal Grandmother   . Breast cancer Paternal Aunt 60  . Ovarian cancer Paternal Aunt     SOCIAL HISTORY:  reports that she quit smoking about 4 years ago. Her smoking use included e-cigarettes. She smoked 0.50 packs per day. She has never used smokeless tobacco. She reports that she does not drink  alcohol or use drugs.  PERFORMANCE STATUS: The patient's performance status is 1 - Symptomatic but completely ambulatory  PHYSICAL EXAM: Most Recent Vital Signs: Last menstrual period 10/05/2019. LMP 10/05/2019   General Appearance:    Alert, cooperative, no distress, appears stated age  Head:    Normocephalic, without obvious abnormality, atraumatic  Eyes:    PERRL, conjunctiva/corneas clear, EOM's intact, fundi    benign, both eyes        Throat:   Lips, mucosa, and tongue normal; teeth and gums normal  Neck:   Supple, symmetrical, trachea midline, no adenopathy;    thyroid:  no enlargement/tenderness/nodules; no carotid   bruit or JVD  Back:     Symmetric, no curvature, ROM normal, no CVA tenderness  Lungs:     Clear to auscultation bilaterally, respirations unlabored  Chest Wall:    No tenderness or deformity   Heart:    Regular rate and rhythm, S1 and S2 normal, no murmur, rub   or gallop     Abdomen:     Soft, non-tender, bowel sounds active all four quadrants,    no masses, no organomegaly        Extremities:   Extremities normal, atraumatic, no cyanosis or edema  Pulses:   2+ and symmetric all extremities  Skin:   Skin color, texture, turgor normal, no rashes or lesions  Lymph nodes:   Cervical, supraclavicular, and axillary nodes normal  Neurologic:   CNII-XII intact, normal strength, sensation and reflexes    throughout    LABORATORY DATA:  Results for orders placed or performed in visit on 10/20/19 (from the past 48 hour(s))  CBC with Differential (Cancer Center Only)     Status: Abnormal   Collection Time: 10/20/19  1:17 PM  Result Value Ref Range   WBC Count 8.0 4.0 - 10.5 K/uL   RBC 4.58 3.87 - 5.11 MIL/uL   Hemoglobin 12.2 12.0 - 15.0 g/dL   HCT 39.2 36.0 - 46.0 %   MCV 85.6 80.0 - 100.0 fL   MCH 26.6 26.0 - 34.0 pg   MCHC 31.1 30.0 - 36.0 g/dL   RDW 17.8 (H) 11.5 - 15.5 %   Platelet Count 279 150 - 400 K/uL   nRBC 0.0 0.0 - 0.2 %   Neutrophils  Relative % 73 %   Neutro Abs 5.9 1.7 - 7.7 K/uL   Lymphocytes Relative 20 %   Lymphs Abs 1.6 0.7 - 4.0 K/uL   Monocytes Relative 5 %   Monocytes Absolute 0.4 0.1 - 1.0 K/uL   Eosinophils Relative 1 %   Eosinophils Absolute 0.1 0.0 - 0.5 K/uL   Basophils Relative 0 %   Basophils Absolute 0.0 0.0 - 0.1 K/uL   Immature Granulocytes 1 %   Abs Immature Granulocytes 0.07 0.00 - 0.07  K/uL    Comment: Performed at [facility] at [facility], [address], [place], [place] [address]  [facility])     Status: None   Collection Time: 10/20/19  1:17 PM  Result Value Ref Range   Smear Review SMEAR STAINED AND AVAILABLE FOR REVIEW     Comment: Performed at [facility] at [facility], [address], [place], [place] [address]  Reticulocytes     Status: None   Collection Time: 10/20/19  1:17 PM  Result Value Ref Range   Retic Ct Pct 1.1 0.4 - 3.1 %   RBC. 4.56 3.87 - 5.11 MIL/uL   Retic Count, Absolute 47.9 19.0 - 186.0 K/uL   Immature Retic Fract 6.7 2.3 - 15.9 %    Comment: Performed at [facility] at [facility] [address], [place], [place] [address]      RADIOGRAPHY: No results found.     PATHOLOGY: None  ASSESSMENT/PLAN: Ms. Novitski is a very pleasant 48 yo caucasian female with iron deficiency anemia secondary to heavy irregular cycles.  She is tolerating oral iron nicely at this time. Hgb and MCV have improved.  We will see what her iron studies look like and if still quite low we will look at setting her up for IV iron infusions.  We will go ahead and plan to see her back in another 8 weeks.   All questions were answered and she is in agreement with the plan. She will contact our office with any questions or concerns. We can certainly see her sooner if needed.   She was discussed with and also seen by Dr. Maylon Peppers and he is in agreement with the aforementioned.   [name], NP       Addendum: I saw and evaluated the patient and reviewed NP Reshaun Briseno's note, and agree with the findings and plan as detailed.  I reviewed the patient's records in detail, including gynecology clinic notes and lab studies.  In summary, patient has had a chronic menorrhagia for the past several months, and Hgb was noted to be mildly low with ferritin of 2.  She was started on daily oral iron supplement, and she has not had significant side effects, such as constipation or GI upset.  She has some fatigue and mild exertional dyspnea with climbing stairs.  She is being followed by gynecology for menorrhagia, and is considering a partial hysterectomy next year.  She denies any other symptoms of bleeding, such as hematochezia, melena, hematuria.  I discussed in detail with the patient the management of iron deficiency anemia.  As her anemia seemed to improve with oral iron supplement (Hgb 12.2 today), we can continue a trial of oral iron supplement.  We also discussed some of the risks, benefits, and alternatives of intravenous iron infusions.  Some of the side-effects to be expected including risks of infusion reactions, phlebitis, headaches, nausea and fatigue.  If her iron profile shows severe iron deficiency, we can consider adding IV iron.  She will ultimately require the control of the bleeding source (i.e. menorrhagia) to avoid recurrent iron deficiency anemia.  Patient expressed understanding, and was in agreement with plan.  We will see the patient back in 2 months, and if her anemia does not improve, IV iron can also be considered then.  Tish Men, MD 10/20/2019 3:42 PM

## 2019-10-21 LAB — IRON AND TIBC
Iron: 320 ug/dL — ABNORMAL HIGH (ref 41–142)
Saturation Ratios: 98 % — ABNORMAL HIGH (ref 21–57)
TIBC: 326 ug/dL (ref 236–444)
UIBC: 6 ug/dL — ABNORMAL LOW (ref 120–384)

## 2019-10-21 LAB — ERYTHROPOIETIN: Erythropoietin: 9.8 m[IU]/mL (ref 2.6–18.5)

## 2019-10-21 LAB — FERRITIN: Ferritin: 12 ng/mL (ref 11–307)

## 2019-10-22 ENCOUNTER — Telehealth: Payer: Self-pay | Admitting: Hematology & Oncology

## 2019-10-22 ENCOUNTER — Telehealth: Payer: Self-pay | Admitting: *Deleted

## 2019-10-22 NOTE — Telephone Encounter (Signed)
Appointments scheduled letter/calendar mailed per 11/17 los

## 2019-10-22 NOTE — Telephone Encounter (Signed)
-----   Message from Eliezer Bottom, NP sent at 10/22/2019  1:02 PM EST ----- Can stay on her oral iron supplement! We will see her back in a couple months.   ----- Message ----- From: Tish Men, MD Sent: 10/22/2019   9:11 AM EST To: Eliezer Bottom, NP  Let's keep the oral iron for now. Her iron profile looks a little strange, with high iron saturation, but low ferritin. Let's recheck it in 2 months.  Thanks.  [name] [name]  ----- Message ----- From: Eliezer Bottom, NP Sent: 10/22/2019   8:58 AM EST To: Tish Men, MD  Continue oral iron?  ----- Message ----- From: Buel Ream, [facility] Sent: 10/20/2019   1:29 PM EST To: Eliezer Bottom, NP

## 2019-10-22 NOTE — Telephone Encounter (Signed)
Patient notified per order of S. Nelson NP to "stay on her oral iron supplement!  We will see her back in a couple months." Pt appreciative of call and has no questions or concerns at this time.

## 2019-10-27 ENCOUNTER — Encounter: Payer: Self-pay | Admitting: Family

## 2019-12-08 ENCOUNTER — Other Ambulatory Visit: Payer: 59

## 2019-12-08 ENCOUNTER — Ambulatory Visit: Payer: 59 | Admitting: Family

## 2019-12-22 ENCOUNTER — Inpatient Hospital Stay: Payer: 59 | Attending: Family

## 2019-12-22 ENCOUNTER — Telehealth: Payer: Self-pay | Admitting: Family

## 2019-12-22 ENCOUNTER — Other Ambulatory Visit: Payer: Self-pay

## 2019-12-22 ENCOUNTER — Encounter: Payer: Self-pay | Admitting: Family

## 2019-12-22 ENCOUNTER — Inpatient Hospital Stay (HOSPITAL_BASED_OUTPATIENT_CLINIC_OR_DEPARTMENT_OTHER): Payer: 59 | Admitting: Family

## 2019-12-22 VITALS — BP 111/59 | HR 62 | Temp 97.3°F | Resp 18 | Ht 69.5 in | Wt 150.0 lb

## 2019-12-22 DIAGNOSIS — Z79899 Other long term (current) drug therapy: Secondary | ICD-10-CM | POA: Insufficient documentation

## 2019-12-22 DIAGNOSIS — D500 Iron deficiency anemia secondary to blood loss (chronic): Secondary | ICD-10-CM

## 2019-12-22 DIAGNOSIS — N920 Excessive and frequent menstruation with regular cycle: Secondary | ICD-10-CM | POA: Diagnosis not present

## 2019-12-22 LAB — CBC WITH DIFFERENTIAL (CANCER CENTER ONLY)
Abs Immature Granulocytes: 0.02 10*3/uL (ref 0.00–0.07)
Basophils Absolute: 0 10*3/uL (ref 0.0–0.1)
Basophils Relative: 0 %
Eosinophils Absolute: 0.1 10*3/uL (ref 0.0–0.5)
Eosinophils Relative: 1 %
HCT: 42.1 % (ref 36.0–46.0)
Hemoglobin: 13.8 g/dL (ref 12.0–15.0)
Immature Granulocytes: 0 %
Lymphocytes Relative: 22 %
Lymphs Abs: 1.6 10*3/uL (ref 0.7–4.0)
MCH: 29.8 pg (ref 26.0–34.0)
MCHC: 32.8 g/dL (ref 30.0–36.0)
MCV: 90.9 fL (ref 80.0–100.0)
Monocytes Absolute: 0.5 10*3/uL (ref 0.1–1.0)
Monocytes Relative: 6 %
Neutro Abs: 5.2 10*3/uL (ref 1.7–7.7)
Neutrophils Relative %: 71 %
Platelet Count: 231 10*3/uL (ref 150–400)
RBC: 4.63 MIL/uL (ref 3.87–5.11)
RDW: 14.1 % (ref 11.5–15.5)
WBC Count: 7.4 10*3/uL (ref 4.0–10.5)
nRBC: 0 % (ref 0.0–0.2)

## 2019-12-22 LAB — RETICULOCYTES
Immature Retic Fract: 3.5 % (ref 2.3–15.9)
RBC.: 4.6 MIL/uL (ref 3.87–5.11)
Retic Count, Absolute: 37.3 10*3/uL (ref 19.0–186.0)
Retic Ct Pct: 0.8 % (ref 0.4–3.1)

## 2019-12-22 NOTE — Progress Notes (Signed)
Hematology and Oncology Follow Up Visit  [name] [name] [id number] 1971/08/20 49 y.o. 12/22/2019   Principle Diagnosis:  Iron deficiency anemia secondary to heavy cycle   Current Therapy:   Ferrous sulfate 200 mg PO daily    Interim History:  [name] [name] is here today for follow-up. She still has fatigue at times though this does seem somewhat improved. Hgb is now 13.8, MCV 90.  Her cycle is now regular but still heavy with clots. She plans to follow-up with her gynecologist to discuss possible hysterectomy.  No other bleeding noted. No bruising or petechiae.  No fever, chills, n/v, cough, rash, dizziness, SOB, chest pain, palpitations, abdominal pain or changes in bowel or bladder habits.  No swelling, tenderness, numbness or tingling in her extremities.  No falls or syncope.  She has maintained a good appetite and is staying well hydrated. Her weight is stable.   ECOG Performance Status: 1 - Symptomatic but completely ambulatory  Medications:  Allergies as of 12/22/2019      Reactions   Clarithromycin Nausea Only      Medication List       Accurate as of December 22, 2019  2:25 PM. If you have any questions, ask your nurse or doctor.        B-12 Compliance Injection 1000 MCG/ML Kit Generic drug: Cyanocobalamin   CALCIUM 500 PO Take by mouth daily.   Feosol 200 (65 Fe) MG Tabs Generic drug: Ferrous Sulfate Dried       Allergies:  Allergies  Allergen Reactions  . Clarithromycin Nausea Only    Past Medical History, Surgical history, Social history, and Family History were reviewed and updated.  Review of Systems: All other 10 point review of systems is negative.   Physical Exam:  height is 5' 9.5" (1.765 m) and weight is 150 lb (68 kg). Her temporal temperature is 97.3 F (36.3 C) (abnormal). Her blood pressure is 111/59 (abnormal) and her pulse is 62. Her respiration is 18 and oxygen saturation is 100%.   Wt Readings from Last 3 Encounters:  12/22/19  150 lb (68 kg)  10/20/19 150 lb (68 kg)  10/09/19 149 lb (67.6 kg)    Ocular: Sclerae unicteric, pupils equal, round and reactive to light Ear-nose-throat: Oropharynx clear, dentition fair Lymphatic: No cervical or supraclavicular adenopathy Lungs no rales or rhonchi, good excursion bilaterally Heart regular rate and rhythm, no murmur appreciated Abd soft, nontender, positive bowel sounds, no liver or spleen tip palpated on exam, no fluid wave  MSK no focal spinal tenderness, no joint edema Neuro: non-focal, well-oriented, appropriate affect Breasts: Deferred   Lab Results  Component Value Date   WBC 7.4 12/22/2019   HGB 13.8 12/22/2019   HCT 42.1 12/22/2019   MCV 90.9 12/22/2019   PLT 231 12/22/2019   Lab Results  Component Value Date   FERRITIN 12 10/20/2019   IRON 320 (H) 10/20/2019   TIBC 326 10/20/2019   UIBC 6 (L) 10/20/2019   IRONPCTSAT 98 (H) 10/20/2019   Lab Results  Component Value Date   RETICCTPCT 0.8 12/22/2019   RBC 4.63 12/22/2019   RBC 4.60 12/22/2019   No results found for: KPAFRELGTCHN, LAMBDASER, KAPLAMBRATIO No results found for: IGGSERUM, IGA, IGMSERUM No results found for: TOTALPROTELP, ALBUMINELP, A1GS, A2GS, BETS, BETA2SER, GAMS, MSPIKE, SPEI   Chemistry      Component Value Date/Time   NA 142 10/20/2019 1317   K 4.2 10/20/2019 1317   CL 105 10/20/2019 1317   CO2 29  10/20/2019 1317   BUN 8 10/20/2019 1317   CREATININE 0.76 10/20/2019 1317   CREATININE 0.69 02/17/2016 1400      Component Value Date/Time   CALCIUM 9.9 10/20/2019 1317   ALKPHOS 59 10/20/2019 1317   AST 23 10/20/2019 1317   ALT 19 10/20/2019 1317   BILITOT 0.3 10/20/2019 1317       Impression and Plan: [name] [name] is a very pleasant 49 yo caucasian female with iron deficiency anemia secondary to heavy cycles.  She will continue her same daily oral iron supplement.  We will see her back in another 4 months.  She will contact our office with any questions or concerns.  We can certainly see her sooner if needed.   [name], NP 1/19/20212:25 PM

## 2019-12-22 NOTE — Telephone Encounter (Signed)
Appointments scheduled calendar printed per 1/19 los 

## 2019-12-23 ENCOUNTER — Encounter: Payer: Self-pay | Admitting: *Deleted

## 2019-12-23 LAB — IRON AND TIBC
Iron: 61 ug/dL (ref 41–142)
Saturation Ratios: 18 % — ABNORMAL LOW (ref 21–57)
TIBC: 345 ug/dL (ref 236–444)
UIBC: 284 ug/dL (ref 120–384)

## 2019-12-23 LAB — FERRITIN: Ferritin: 8 ng/mL — ABNORMAL LOW (ref 11–307)

## 2020-04-20 ENCOUNTER — Other Ambulatory Visit: Payer: Self-pay

## 2020-04-20 ENCOUNTER — Inpatient Hospital Stay: Payer: 59 | Attending: Family

## 2020-04-20 ENCOUNTER — Inpatient Hospital Stay (HOSPITAL_BASED_OUTPATIENT_CLINIC_OR_DEPARTMENT_OTHER): Payer: 59 | Admitting: Family

## 2020-04-20 ENCOUNTER — Encounter: Payer: Self-pay | Admitting: Family

## 2020-04-20 VITALS — BP 106/56 | HR 69 | Temp 97.5°F | Resp 17 | Wt 144.5 lb

## 2020-04-20 DIAGNOSIS — E538 Deficiency of other specified B group vitamins: Secondary | ICD-10-CM | POA: Diagnosis not present

## 2020-04-20 DIAGNOSIS — Z79899 Other long term (current) drug therapy: Secondary | ICD-10-CM | POA: Diagnosis not present

## 2020-04-20 DIAGNOSIS — D500 Iron deficiency anemia secondary to blood loss (chronic): Secondary | ICD-10-CM

## 2020-04-20 DIAGNOSIS — N920 Excessive and frequent menstruation with regular cycle: Secondary | ICD-10-CM | POA: Diagnosis not present

## 2020-04-20 LAB — CBC WITH DIFFERENTIAL (CANCER CENTER ONLY)
Abs Immature Granulocytes: 0.12 10*3/uL — ABNORMAL HIGH (ref 0.00–0.07)
Basophils Absolute: 0 10*3/uL (ref 0.0–0.1)
Basophils Relative: 1 %
Eosinophils Absolute: 0.2 10*3/uL (ref 0.0–0.5)
Eosinophils Relative: 3 %
HCT: 41.4 % (ref 36.0–46.0)
Hemoglobin: 13.9 g/dL (ref 12.0–15.0)
Immature Granulocytes: 1 %
Lymphocytes Relative: 21 %
Lymphs Abs: 1.8 10*3/uL (ref 0.7–4.0)
MCH: 30.5 pg (ref 26.0–34.0)
MCHC: 33.6 g/dL (ref 30.0–36.0)
MCV: 90.8 fL (ref 80.0–100.0)
Monocytes Absolute: 0.6 10*3/uL (ref 0.1–1.0)
Monocytes Relative: 7 %
Neutro Abs: 5.8 10*3/uL (ref 1.7–7.7)
Neutrophils Relative %: 67 %
Platelet Count: 234 10*3/uL (ref 150–400)
RBC: 4.56 MIL/uL (ref 3.87–5.11)
RDW: 11.8 % (ref 11.5–15.5)
WBC Count: 8.7 10*3/uL (ref 4.0–10.5)
nRBC: 0 % (ref 0.0–0.2)

## 2020-04-20 LAB — RETICULOCYTES
Immature Retic Fract: 5.5 % (ref 2.3–15.9)
RBC.: 4.6 MIL/uL (ref 3.87–5.11)
Retic Count, Absolute: 42.3 10*3/uL (ref 19.0–186.0)
Retic Ct Pct: 0.9 % (ref 0.4–3.1)

## 2020-04-20 LAB — VITAMIN B12: Vitamin B-12: 444 pg/mL (ref 180–914)

## 2020-04-20 NOTE — Progress Notes (Signed)
Hematology and Oncology Follow Up Visit  [name] [name] [id number] Jun 24, 1971 48 y.o. 04/20/2020   Principle Diagnosis:  Iron deficiency anemia secondary to heavy cycle  Current Therapy:        Ferrous sulfate 200 mg PO daily    Interim History:  [name] [name] is here today for follow-up. She is doing well but has noted some dizziness when standing too quickly.  No falls or syncopal episodes to report.  Her cycles is regular and quite heavy. No other blood loss noted. No bruising or petechiae. No fever, chills, n/v, cough, rash, dizziness, SOB, chest pain or palpitations, abdominal pain or changes in bowel or bladder habits.  No swelling, tenderness, numbness or tingling in her extremities. She has maintained a good appetite and is staying well hydrated. Her weight is stable.   ECOG Performance Status: 1 - Symptomatic but completely ambulatory  Medications:  Allergies as of 04/20/2020      Reactions   Clarithromycin Nausea Only      Medication List       Accurate as of Apr 20, 2020  1:54 PM. If you have any questions, ask your nurse or doctor.        B-12 Compliance Injection 1000 MCG/ML Kit Generic drug: Cyanocobalamin   CALCIUM 500 PO Take by mouth daily.   Feosol 200 (65 Fe) MG Tabs Generic drug: Ferrous Sulfate Dried   omeprazole 10 MG capsule Commonly known as: PRILOSEC Take 10 mg by mouth as needed.       Allergies:  Allergies  Allergen Reactions  . Clarithromycin Nausea Only    Past Medical History, Surgical history, Social history, and Family History were reviewed and updated.  Review of Systems: All other 10 point review of systems is negative.   Physical Exam:  weight is 144 lb 8 oz (65.5 kg). Her temporal temperature is 97.5 F (36.4 C) (abnormal). Her blood pressure is 106/56 (abnormal) and her pulse is 69. Her respiration is 17 and oxygen saturation is 100%.   Wt Readings from Last 3 Encounters:  04/20/20 144 lb 8 oz (65.5 kg)   12/22/19 150 lb (68 kg)  10/20/19 150 lb (68 kg)    Ocular: Sclerae unicteric, pupils equal, round and reactive to light Ear-nose-throat: Oropharynx clear, dentition fair Lymphatic: No cervical or supraclavicular adenopathy Lungs no rales or rhonchi, good excursion bilaterally Heart regular rate and rhythm, no murmur appreciated Abd soft, nontender, positive bowel sounds, no liver or spleen tip palpated on exam, no fluid wave  MSK no focal spinal tenderness, no joint edema Neuro: non-focal, well-oriented, appropriate affect Breasts: Deferred   Lab Results  Component Value Date   WBC 8.7 04/20/2020   HGB 13.9 04/20/2020   HCT 41.4 04/20/2020   MCV 90.8 04/20/2020   PLT 234 04/20/2020   Lab Results  Component Value Date   FERRITIN 8 (L) 12/22/2019   IRON 61 12/22/2019   TIBC 345 12/22/2019   UIBC 284 12/22/2019   IRONPCTSAT 18 (L) 12/22/2019   Lab Results  Component Value Date   RETICCTPCT 0.9 04/20/2020   RBC 4.60 04/20/2020   No results found for: KPAFRELGTCHN, LAMBDASER, KAPLAMBRATIO No results found for: IGGSERUM, IGA, IGMSERUM No results found for: Ronnald Ramp, A1GS, A2GS, Violet Baldy, MSPIKE, SPEI   Chemistry      Component Value Date/Time   NA 142 10/20/2019 1317   K 4.2 10/20/2019 1317   CL 105 10/20/2019 1317   CO2 29 10/20/2019 1317   BUN  8 10/20/2019 1317   CREATININE 0.76 10/20/2019 1317   CREATININE 0.69 02/17/2016 1400      Component Value Date/Time   CALCIUM 9.9 10/20/2019 1317   ALKPHOS 59 10/20/2019 1317   AST 23 10/20/2019 1317   ALT 19 10/20/2019 1317   BILITOT 0.3 10/20/2019 1317       Impression and Plan: [name] [name] is a very pleasant 49 yo caucasian female with iron deficiency anemia secondary to heavy cycles. She will continue her same daily oral iron supplement.  We will see her back in another 4 months.  She will contact our office with any questions or concerns. We can certainly see her sooner if needed.    [name], NP 5/19/20211:54 PM

## 2020-04-21 LAB — IRON AND TIBC
Iron: 59 ug/dL (ref 41–142)
Saturation Ratios: 21 % (ref 21–57)
TIBC: 284 ug/dL (ref 236–444)
UIBC: 225 ug/dL (ref 120–384)

## 2020-04-21 LAB — FERRITIN: Ferritin: 23 ng/mL (ref 11–307)

## 2020-04-22 ENCOUNTER — Telehealth: Payer: Self-pay | Admitting: Family

## 2020-04-22 NOTE — Telephone Encounter (Signed)
Appointments scheduled calendar printed & mailed per 5/19 los

## 2020-08-19 ENCOUNTER — Inpatient Hospital Stay: Payer: 59 | Attending: Family

## 2020-08-19 ENCOUNTER — Inpatient Hospital Stay (HOSPITAL_BASED_OUTPATIENT_CLINIC_OR_DEPARTMENT_OTHER): Payer: 59 | Admitting: Family

## 2020-08-19 ENCOUNTER — Telehealth: Payer: Self-pay | Admitting: Family

## 2020-08-19 ENCOUNTER — Other Ambulatory Visit: Payer: Self-pay

## 2020-08-19 ENCOUNTER — Encounter: Payer: Self-pay | Admitting: Family

## 2020-08-19 VITALS — BP 96/71 | HR 71 | Temp 96.7°F | Resp 18 | Ht 69.5 in | Wt 146.0 lb

## 2020-08-19 DIAGNOSIS — D509 Iron deficiency anemia, unspecified: Secondary | ICD-10-CM | POA: Diagnosis present

## 2020-08-19 DIAGNOSIS — D500 Iron deficiency anemia secondary to blood loss (chronic): Secondary | ICD-10-CM | POA: Diagnosis not present

## 2020-08-19 DIAGNOSIS — E538 Deficiency of other specified B group vitamins: Secondary | ICD-10-CM

## 2020-08-19 DIAGNOSIS — N920 Excessive and frequent menstruation with regular cycle: Secondary | ICD-10-CM

## 2020-08-19 LAB — CBC WITH DIFFERENTIAL (CANCER CENTER ONLY)
Abs Immature Granulocytes: 0.02 10*3/uL (ref 0.00–0.07)
Basophils Absolute: 0 10*3/uL (ref 0.0–0.1)
Basophils Relative: 1 %
Eosinophils Absolute: 0.2 10*3/uL (ref 0.0–0.5)
Eosinophils Relative: 3 %
HCT: 41.7 % (ref 36.0–46.0)
Hemoglobin: 14.2 g/dL (ref 12.0–15.0)
Immature Granulocytes: 0 %
Lymphocytes Relative: 23 %
Lymphs Abs: 1.5 10*3/uL (ref 0.7–4.0)
MCH: 30.8 pg (ref 26.0–34.0)
MCHC: 34.1 g/dL (ref 30.0–36.0)
MCV: 90.5 fL (ref 80.0–100.0)
Monocytes Absolute: 0.4 10*3/uL (ref 0.1–1.0)
Monocytes Relative: 6 %
Neutro Abs: 4.4 10*3/uL (ref 1.7–7.7)
Neutrophils Relative %: 67 %
Platelet Count: 248 10*3/uL (ref 150–400)
RBC: 4.61 MIL/uL (ref 3.87–5.11)
RDW: 12 % (ref 11.5–15.5)
WBC Count: 6.5 10*3/uL (ref 4.0–10.5)
nRBC: 0 % (ref 0.0–0.2)

## 2020-08-19 LAB — RETICULOCYTES
Immature Retic Fract: 1.5 % — ABNORMAL LOW (ref 2.3–15.9)
RBC.: 4.57 MIL/uL (ref 3.87–5.11)
Retic Count, Absolute: 38.4 10*3/uL (ref 19.0–186.0)
Retic Ct Pct: 0.8 % (ref 0.4–3.1)

## 2020-08-19 LAB — VITAMIN B12: Vitamin B-12: 490 pg/mL (ref 180–914)

## 2020-08-19 NOTE — Progress Notes (Signed)
Hematology and Oncology Follow Up Visit  LAROSE BATRES [id number] December 14, 1970 49 y.o. 08/19/2020   Principle Diagnosis:  Iron deficiency anemia secondary to heavy cycle  Current Therapy: Ferrous sulfate 200 mg PO daily   Interim History:  Ms. Scruggs is here today for follow-up. She is doing well and has no complaints at this time.  She is taking her iron supplement daily.  Her cycle is heavy the first day only. No other blood loss noted.  No abnormal bruising or petechiae.  She has maintained a good appetite and is staying well hydrated. Her weight is stable.  No fever, chills, n/v, cough, rash, dizziness, SOB, chest pain, palpitations, abdominal pain or changes in bowel or bladder habits.  No swelling, tenderness, numbness or tingling in her extremities.  No falls or syncopal episodes of report.   ECOG Performance Status: 1 - Symptomatic but completely ambulatory  Medications:  Allergies as of 08/19/2020      Reactions   Clarithromycin Nausea Only      Medication List       Accurate as of August 19, 2020 11:32 AM. If you have any questions, ask your nurse or doctor.        STOP taking these medications   B-12 Compliance Injection 1000 MCG/ML Kit Generic drug: Cyanocobalamin Stopped by: [name], NP   omeprazole 10 MG capsule Commonly known as: PRILOSEC Stopped by: [name], NP     TAKE these medications   CALCIUM 500 PO Take by mouth daily.   Feosol 200 (65 Fe) MG Tabs Generic drug: Ferrous Sulfate Dried       Allergies:  Allergies  Allergen Reactions  . Clarithromycin Nausea Only    Past Medical History, Surgical history, Social history, and Family History were reviewed and updated.  Review of Systems: All other 10 point review of systems is negative.   Physical Exam:  vitals were not taken for this visit.   Wt Readings from Last 3 Encounters:  04/20/20 144 lb 8 oz (65.5 kg)  12/22/19 150 lb (68 kg)    10/20/19 150 lb (68 kg)    Ocular: Sclerae unicteric, pupils equal, round and reactive to light Ear-nose-throat: Oropharynx clear, dentition fair Lymphatic: No cervical or supraclavicular adenopathy Lungs no rales or rhonchi, good excursion bilaterally Heart regular rate and rhythm, no murmur appreciated Abd soft, nontender, positive bowel sounds MSK no focal spinal tenderness, no joint edema Neuro: non-focal, well-oriented, appropriate affect Breasts: Deferred   Lab Results  Component Value Date   WBC 6.5 08/19/2020   HGB 14.2 08/19/2020   HCT 41.7 08/19/2020   MCV 90.5 08/19/2020   PLT 248 08/19/2020   Lab Results  Component Value Date   FERRITIN 23 04/20/2020   IRON 59 04/20/2020   TIBC 284 04/20/2020   UIBC 225 04/20/2020   IRONPCTSAT 21 04/20/2020   Lab Results  Component Value Date   RETICCTPCT 0.8 08/19/2020   RBC 4.57 08/19/2020   No results found for: KPAFRELGTCHN, LAMBDASER, KAPLAMBRATIO No results found for: IGGSERUM, IGA, IGMSERUM No results found for: Odetta Pink, SPEI   Chemistry      Component Value Date/Time   NA 142 10/20/2019 1317   K 4.2 10/20/2019 1317   CL 105 10/20/2019 1317   CO2 29 10/20/2019 1317   BUN 8 10/20/2019 1317   CREATININE 0.76 10/20/2019 1317   CREATININE 0.69 02/17/2016 1400      Component Value Date/Time   CALCIUM  9.9 10/20/2019 1317   ALKPHOS 59 10/20/2019 1317   AST 23 10/20/2019 1317   ALT 19 10/20/2019 1317   BILITOT 0.3 10/20/2019 1317       Impression and Plan: Ms. Adcox is a very pleasant 49 yo caucasian female with iron deficiency anemia secondary to heavy cycles. She continues to do well on oral iron.  We will see what her iron studies look like and if stable she can go to taking her iron supplement every other day.  She is in agreement with the plan.  Follow-up in 6 months.  She can contact our office with any questions or concerns.   [name], NP 9/17/202111:32 AM

## 2020-08-19 NOTE — Telephone Encounter (Signed)
Appointments scheduled and patient has My Chart Access per 9/17 los

## 2020-08-22 LAB — IRON AND TIBC
Iron: 89 ug/dL (ref 41–142)
Saturation Ratios: 30 % (ref 21–57)
TIBC: 296 ug/dL (ref 236–444)
UIBC: 207 ug/dL (ref 120–384)

## 2020-08-22 LAB — FERRITIN: Ferritin: 17 ng/mL (ref 11–307)

## 2021-02-16 ENCOUNTER — Inpatient Hospital Stay: Payer: 59

## 2021-02-16 ENCOUNTER — Inpatient Hospital Stay: Payer: 59 | Admitting: Family

## 2021-02-23 ENCOUNTER — Inpatient Hospital Stay: Payer: 59 | Admitting: Family

## 2021-02-23 ENCOUNTER — Ambulatory Visit: Payer: 59 | Admitting: Family

## 2021-02-23 ENCOUNTER — Other Ambulatory Visit: Payer: 59

## 2021-02-23 ENCOUNTER — Inpatient Hospital Stay: Payer: 59

## 2021-03-21 ENCOUNTER — Encounter (HOSPITAL_BASED_OUTPATIENT_CLINIC_OR_DEPARTMENT_OTHER): Payer: Self-pay | Admitting: Obstetrics & Gynecology

## 2021-03-21 ENCOUNTER — Ambulatory Visit (INDEPENDENT_AMBULATORY_CARE_PROVIDER_SITE_OTHER): Payer: 59 | Admitting: Obstetrics & Gynecology

## 2021-03-21 ENCOUNTER — Other Ambulatory Visit (HOSPITAL_COMMUNITY)
Admission: RE | Admit: 2021-03-21 | Discharge: 2021-03-21 | Disposition: A | Discharge status: Home / Self Care | Payer: 59 | Source: Ambulatory Visit | Attending: Obstetrics & Gynecology | Admitting: Obstetrics & Gynecology

## 2021-03-21 ENCOUNTER — Other Ambulatory Visit: Payer: Self-pay

## 2021-03-21 VITALS — BP 114/67 | HR 77 | Ht 69.25 in | Wt 148.0 lb

## 2021-03-21 DIAGNOSIS — Z124 Encounter for screening for malignant neoplasm of cervix: Secondary | ICD-10-CM | POA: Diagnosis present

## 2021-03-21 DIAGNOSIS — Z01419 Encounter for gynecological examination (general) (routine) without abnormal findings: Secondary | ICD-10-CM | POA: Diagnosis not present

## 2021-03-21 DIAGNOSIS — G8929 Other chronic pain: Secondary | ICD-10-CM

## 2021-03-21 DIAGNOSIS — E611 Iron deficiency: Secondary | ICD-10-CM

## 2021-03-21 DIAGNOSIS — R1031 Right lower quadrant pain: Secondary | ICD-10-CM

## 2021-03-21 DIAGNOSIS — M25511 Pain in right shoulder: Secondary | ICD-10-CM

## 2021-03-21 MED ORDER — NORETHINDRONE 0.35 MG PO TABS: 1.0000 | ORAL_TABLET | Freq: Every day | ORAL | 1 refills | Status: DC

## 2021-03-21 NOTE — Progress Notes (Signed)
50 y.o. G50P0011 Married White or Caucasian female here for annual exam.  Cycles continue to be heavy with at least two heavy days.  Cycles are still regular.  For the last week, she's having RLQ pain.  She's had this in the past with ovarian cysts.    H/o iron deficiency.  Ferritin was 2.  She has seen Waymond Cera.  Oral iron has helped ferritin levels.  Hb was 14.2 the last check which was 08/2020.    Has hx of right shoulder surgery.  Has been ortho again due to worsening pain.  Has seen pain management.  Nerve block recommended.  Had pneumothorax, then chest tube that was placed for 72 hours.  This was in 2021.        Sexually active: Yes.    The current method of family planning is condoms always.    Exercising: Yes.   treadmill Smoker:  yes  Health Maintenance: Pap:  2019 History of abnormal Pap:  Remote hx MMG:  01/2019 [facility].  Pt knows this is due. Colonoscopy:  Guidelines reviewed.   tDaP:  04/2012 Screening Labs: done with Denny Peon, FNP   reports that she quit smoking about 5 years ago. Her smoking use included e-cigarettes. She smoked 0.50 packs per day. She has never used smokeless tobacco. She reports that she does not drink alcohol and does not use drugs.  Past Medical History:  Diagnosis Date  . Endocervical polyp 4/07   benign  . Migraine    without aura    Past Surgical History:  Procedure Laterality Date  . DIAGNOSTIC LAPAROSCOPY  5/12   secondary to pain/negative  . 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 SHOULDER SURGERY Right 09/2016  . SHOULDER SURGERY Right 09/2018  . TONSILLECTOMY AND ADENOIDECTOMY      Current Outpatient Medications  Medication Sig Dispense Refill  . Calcium-Magnesium-Vitamin D (CALCIUM 500 PO) Take by mouth daily.    . Cholecalciferol 25 MCG (1000 UT) capsule Take 1,000 Units by mouth daily.     . Cyanocobalamin 3000 MCG SUBL Place 3,000 mcg under the tongue daily  with breakfast.    . Ferrous Sulfate Dried (FEOSOL) 200 (65 Fe) MG TABS      No current facility-administered medications for this visit.    Family History  Problem Relation Age of Onset  . Hypertension Father   . Thyroid disease Mother        thyroidectomy  . Osteoporosis Mother   . Osteoporosis Maternal Grandmother   . Breast cancer Paternal Aunt 50  . Ovarian cancer Paternal Aunt     Review of Systems  Gastrointestinal: Negative.   Genitourinary: Positive for pelvic pain (rlq x 7 days). Negative for dyspareunia.  Musculoskeletal: Positive for arthralgias (right shoulder pain).  All other systems reviewed and are negative.   Exam:   BP 114/67   Pulse 77   Ht 5' 9.25" (1.759 m)   Wt 148 lb (67.1 kg)   BMI 21.70 kg/m   Height: 5' 9.25" (175.9 cm)  General appearance: alert, cooperative and appears stated age Head: Normocephalic, without obvious abnormality, atraumatic Neck: no adenopathy, supple, symmetrical, trachea midline and thyroid normal to inspection and palpation Lungs: clear to auscultation bilaterally Breasts: normal appearance, no masses or tenderness Heart: regular rate and rhythm Abdomen: soft, non-tender; bowel sounds normal; no masses,  no organomegaly Extremities: extremities normal, atraumatic, no cyanosis or edema Skin: Skin color, texture, turgor normal. No  rashes or lesions Lymph nodes: Cervical, supraclavicular, and axillary nodes normal. No abnormal inguinal nodes palpated Neurologic: Grossly normal   Pelvic: External genitalia:  no lesions              Urethra:  normal appearing urethra with no masses, tenderness or lesions              Bartholins and Skenes: normal                 Vagina: normal appearing vagina with normal color and discharge, no lesions              Cervix: no lesions              Pap taken: Yes.   Bimanual Exam:  Uterus:  normal size, contour, position, consistency, mobility, non-tender              Adnexa: mild right  adnexal fullness and tenderness, mobile; left adnexa/ovary normal without fullness/tenderness               Rectovaginal: Confirms               Anus:  normal sphincter tone, no lesions  Chaperone, Margret Chance, CMA, was present for exam.  Assessment/Plan: 1. Well woman exam with routine gynecological exam - pap and HR HPV obtained today - MMG 01/2019.  Pt aware this is due. - colon cancer screening guidelines reviewed.  Declines referral right now. - vaccines reviewed - screening blood work done with NP in Dr. Lindaann Slough office  2. RLQ abdominal pain - will start micronor, 1 po q day.  #3 month supply/1Rf.  Pt to give update in 1 week.  If still having pain, will proceed with PUS.  Given h/o ovarian cysts and non surgical abdomen on exam today, feel ok to monitor.  Pt is sensitive to hormonal therapy so may not tolerate POPs but feel worth trying  3. Chronic right shoulder pain - followed by ortho  4. Iron deficiency - followed by hematology, Leola Brazil  - on oral iron with good improvement in ferritin levels

## 2021-03-22 DIAGNOSIS — E611 Iron deficiency: Secondary | ICD-10-CM | POA: Insufficient documentation

## 2021-03-23 LAB — CYTOLOGY - PAP
Comment: NEGATIVE
Diagnosis: NEGATIVE
High risk HPV: NEGATIVE

## 2021-06-09 ENCOUNTER — Ambulatory Visit (HOSPITAL_BASED_OUTPATIENT_CLINIC_OR_DEPARTMENT_OTHER): Payer: 59 | Admitting: Obstetrics & Gynecology

## 2022-10-22 ENCOUNTER — Telehealth (HOSPITAL_BASED_OUTPATIENT_CLINIC_OR_DEPARTMENT_OTHER): Payer: Self-pay | Admitting: Obstetrics & Gynecology

## 2022-10-22 NOTE — Telephone Encounter (Signed)
Patient called and left a messages that she is having pain and spotting .

## 2022-10-23 ENCOUNTER — Encounter (HOSPITAL_BASED_OUTPATIENT_CLINIC_OR_DEPARTMENT_OTHER): Payer: Self-pay | Admitting: Medical

## 2022-10-23 ENCOUNTER — Ambulatory Visit (HOSPITAL_BASED_OUTPATIENT_CLINIC_OR_DEPARTMENT_OTHER): Payer: 59 | Admitting: Medical

## 2022-10-23 VITALS — BP 124/71 | HR 74 | Ht 69.5 in | Wt 153.2 lb

## 2022-10-23 DIAGNOSIS — R102 Pelvic and perineal pain: Secondary | ICD-10-CM | POA: Diagnosis not present

## 2022-10-23 NOTE — Progress Notes (Signed)
History:  [name] is a 51 y.o. G2P0011 who presents to clinic today for RLQ pain x 4 days. She has a history of ovarian cysts and is concerned that another cyst may be causing her pain. LMP 10/14/22. Pain is constant, worse with movement. It was improving last night, but worse again this morning. Pain is rated at 3/10 now. She took ibuprofen which has helped some and tramadol which has not helped. She denies vaginal bleeding, discharge, UTI symptoms, fever, diarrhea, constipation or vomiting. She has had some nausea today, but also has a migraine.   The following portions of the patient's history were reviewed and updated as appropriate: allergies, current medications, family history, past medical history, social history, past surgical history and problem list.  Review of Systems:  Review of Systems  Constitutional:  Negative for fever and malaise/fatigue.  Gastrointestinal:  Positive for abdominal pain and nausea. Negative for constipation, diarrhea and vomiting.  Genitourinary:  Negative for dysuria, frequency and urgency.       Neg - vaginal bleeding, discharge, pelvic pain      Objective:  Physical Exam BP 124/71 (BP Location: Right Arm, Patient Position: Sitting, Cuff Size: Normal)   Pulse 74   Ht 5' 9.5" (1.765 m) Comment: Reported  Wt 153 lb 3.2 oz (69.5 kg)   BMI 22.30 kg/m  Physical Exam Vitals and nursing note reviewed. Exam conducted with a chaperone present.  Constitutional:      General: She is not in acute distress.    Appearance: Normal appearance. She is well-developed and normal weight.  HENT:     Head: Normocephalic and atraumatic.  Cardiovascular:     Rate and Rhythm: Normal rate.  Pulmonary:     Effort: Pulmonary effort is normal.  Abdominal:     General: Abdomen is flat. There is no distension.     Palpations: Abdomen is soft. There is no mass.     Tenderness: There is no abdominal tenderness. There is no guarding or rebound.   Genitourinary:    General: Normal vulva.     Uterus: Not enlarged and not tender.      Adnexa:        Right: No mass, tenderness or fullness.         Left: No mass, tenderness or fullness.    Skin:    General: Skin is warm and dry.     Findings: No erythema.  Neurological:     Mental Status: She is alert and oriented to person, place, and time.  Psychiatric:        Mood and Affect: Mood normal.     Health Maintenance Due  Topic Date Due   HIV Screening  Never done   Hepatitis C Screening  Never done   COLONOSCOPY (Pts 45-37yrs Insurance coverage will need to be confirmed)  Never done   INFLUENZA VACCINE  Never done   COVID-19 Vaccine (4 - 2023-24 season) 08/03/2022    Labs, imaging and previous visits in Epic reviewed  Assessment & Plan:  1. Pelvic pain - Possible small ovarian cyst, offered [place] to confirm vs monitoring and NSAIDs as indicated for pain - Patient would like to monitor and take NSAIDs and will let our office know if symptoms change or worsen - Otherwise she is scheduled for an annual exam in a few months and will keep that appointment as planned  Approximately 18 minutes of total time was spent with this patient on history taking, chart review,  physical exam, patient education and documentation.   Marny Lowenstein, PA-C 10/23/2022 10:42 AM

## 2022-12-24 ENCOUNTER — Encounter (HOSPITAL_BASED_OUTPATIENT_CLINIC_OR_DEPARTMENT_OTHER): Payer: Self-pay | Admitting: Obstetrics & Gynecology

## 2022-12-24 ENCOUNTER — Ambulatory Visit (INDEPENDENT_AMBULATORY_CARE_PROVIDER_SITE_OTHER): Payer: 59 | Admitting: Obstetrics & Gynecology

## 2022-12-24 VITALS — BP 121/62 | HR 65 | Ht 69.5 in | Wt 154.8 lb

## 2022-12-24 DIAGNOSIS — Z01419 Encounter for gynecological examination (general) (routine) without abnormal findings: Secondary | ICD-10-CM

## 2022-12-24 DIAGNOSIS — Z1211 Encounter for screening for malignant neoplasm of colon: Secondary | ICD-10-CM

## 2022-12-24 DIAGNOSIS — F411 Generalized anxiety disorder: Secondary | ICD-10-CM

## 2022-12-24 DIAGNOSIS — E611 Iron deficiency: Secondary | ICD-10-CM

## 2022-12-24 NOTE — Progress Notes (Signed)
52 y.o. G80P0011 Married White or Caucasian female here for annual exam.  Doing well.  Cycles are regular.  Flow heavy for first two days.  Saw Almyra Free in November due to RLQ pain.  H/o ovarian cysts.  Pt wanted to make sure everything was normal.  Possible options for treatment for bleeding discussed including TXA.  She wants to monitor and let me know if has any desire to try this medication.  Aware should not smoke with it but is not.   Patient's last menstrual period was 12/09/2022.          Sexually active: Yes.    The current method of family planning is condoms Exercising: Yes.     treadmill Smoker:  no  Health Maintenance: Pap:  03/21/2021 Negative History of abnormal Pap:  remote hx MMG:  05/11/2022 Colonoscopy:  has been referred.  Has not done yet.  No family hx.  Cologuard ordered. Screening Labs: 10/2022   reports that she quit smoking about 7 years ago. Her smoking use included e-cigarettes and cigarettes. She smoked an average of .5 packs per day. She has never used smokeless tobacco. She reports that she does not drink alcohol and does not use drugs.  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     Cholecalciferol 25 MCG (1000 UT) capsule Take 1,000 Units by mouth daily.      Cyanocobalamin 3000 MCG SUBL Place 3,000 mcg under the tongue daily with breakfast.     Ferrous Sulfate Dried (FEOSOL) 200 (65 Fe) MG TABS      omeprazole (PRILOSEC OTC) 20 MG tablet Take 20 mg by mouth  daily.     No current facility-administered medications for this visit.    Family History  Problem Relation Age of Onset   Hypertension Father    Thyroid disease Mother        thyroidectomy   Osteoporosis Mother    Osteoporosis Maternal Grandmother    Breast cancer Paternal Aunt 72   Ovarian cancer Paternal Aunt     ROS: Constitutional: negative Genitourinary:negative  Exam:   BP 121/62 (BP Location: Right Arm, Patient Position: Sitting, Cuff Size: Normal)   Pulse 65   Ht 5' 9.5" (1.765 m) Comment: Reported  Wt 154 lb 12.8 oz (70.2 kg)   LMP 12/09/2022   BMI 22.53 kg/m   Height: 5' 9.5" (176.5 cm) (Reported)  General appearance: alert, cooperative and appears stated age Head: Normocephalic, without obvious abnormality, atraumatic Neck: no adenopathy, supple, symmetrical, trachea midline and thyroid normal to inspection and palpation Lungs: clear to auscultation bilaterally Breasts: normal appearance, no masses or tenderness Heart: regular rate and rhythm Abdomen: soft, non-tender; bowel sounds normal; no masses,  no organomegaly Extremities: extremities normal, atraumatic, no cyanosis or edema Skin: Skin color, texture, turgor normal. No rashes or lesions Lymph nodes: Cervical, supraclavicular, and axillary nodes normal. No abnormal inguinal nodes palpated Neurologic: Grossly normal   Pelvic: External genitalia:  no lesions  Urethra:  normal appearing urethra with no masses, tenderness or lesions              Bartholins and Skenes: normal                 Vagina: normal appearing vagina with normal color and no discharge, no lesions              Cervix: no lesions              Pap taken: No. Bimanual Exam:  Uterus:  normal size, contour, position, consistency, mobility, non-tender              Adnexa: normal adnexa and no mass, fullness, tenderness               Rectovaginal: Confirms               Anus:  normal sphincter tone, no lesions  Chaperone,  Octaviano Batty, CMA, was present for exam.  Assessment/Plan: 1. Well woman exam with routine gynecological exam - Pap smear 03/2021 - Mammogram 05/2022 - Colonoscopy discussed.  Willing to do cologuard - lab work done with PCP's office 10/2022 - vaccines reviewed/updated.  She is aware tdap is due.  Will plan to get if she has an exposure. Declines today.  2. Colon cancer screening - Cologuard  3. GAD (generalized anxiety disorder)  4. Iron deficiency - takes every other day.  Has seen hematology.

## 2024-01-01 ENCOUNTER — Ambulatory Visit (HOSPITAL_BASED_OUTPATIENT_CLINIC_OR_DEPARTMENT_OTHER): Payer: 59 | Admitting: Obstetrics & Gynecology

## 2024-03-02 ENCOUNTER — Ambulatory Visit (HOSPITAL_BASED_OUTPATIENT_CLINIC_OR_DEPARTMENT_OTHER): Payer: 59 | Admitting: Obstetrics & Gynecology

## 2024-03-05 ENCOUNTER — Encounter (HOSPITAL_BASED_OUTPATIENT_CLINIC_OR_DEPARTMENT_OTHER): Payer: Self-pay | Admitting: Obstetrics & Gynecology

## 2024-03-05 ENCOUNTER — Other Ambulatory Visit (HOSPITAL_COMMUNITY)
Admission: RE | Admit: 2024-03-05 | Discharge: 2024-03-05 | Disposition: A | Discharge status: Home / Self Care | Source: Ambulatory Visit | Attending: Obstetrics & Gynecology | Admitting: Obstetrics & Gynecology

## 2024-03-05 ENCOUNTER — Ambulatory Visit (HOSPITAL_BASED_OUTPATIENT_CLINIC_OR_DEPARTMENT_OTHER): Admitting: Obstetrics & Gynecology

## 2024-03-05 VITALS — BP 114/58 | HR 71 | Ht 69.5 in | Wt 152.2 lb

## 2024-03-05 DIAGNOSIS — Z01419 Encounter for gynecological examination (general) (routine) without abnormal findings: Secondary | ICD-10-CM

## 2024-03-05 DIAGNOSIS — Z808 Family history of malignant neoplasm of other organs or systems: Secondary | ICD-10-CM | POA: Diagnosis not present

## 2024-03-05 DIAGNOSIS — Z124 Encounter for screening for malignant neoplasm of cervix: Secondary | ICD-10-CM | POA: Diagnosis present

## 2024-03-05 DIAGNOSIS — N841 Polyp of cervix uteri: Secondary | ICD-10-CM | POA: Diagnosis not present

## 2024-03-05 DIAGNOSIS — Z23 Encounter for immunization: Secondary | ICD-10-CM

## 2024-03-05 NOTE — Progress Notes (Unsigned)
 ANNUAL EXAM Patient name: [name] [name] MRN [id number]  Date of birth: [birth year] Chief Complaint:   AEX  History of Present Illness:   [name] [name] is a 53 y.o. G61P0011 Caucasian female being seen today for a routine annual exam.  Still having cycles and they are fairly regular.    Still having cycles.  LMP:  02/17/2024  Last pap 03/21/2021. Results were: NILM w/ HRHPV negative. H/O abnormal pap: yes Last mammogram: 05/11/2022. Results were: normal. Family h/o breast cancer: yes two aunt, paternal and maternal  Last colonoscopy: scheduled for June.  Has not done one yet or cologuard.  Family h/o colorectal cancer: no     03/05/2024    3:30 PM 12/24/2022   11:08 AM 10/23/2022   10:24 AM  Depression screen PHQ 2/9  Decreased Interest 0 0 0  Down, Depressed, Hopeless 0 0 0  PHQ - 2 Score 0 0 0     Review of Systems:   Pertinent items are noted in HPI  Denies abnormal bleeding or discharge, pelvic pain is mild, does have pelvic cramping with menstrual bleeding, denies urinary issues, denies bowel changes Pertinent History Reviewed:  Reviewed past medical,surgical, social and family history.   Reviewed problem list, medications and allergies. Physical Assessment:   Vitals:   03/05/24 1526  BP: (!) 114/58  Pulse: 71  Weight: 152 lb 3.2 oz (69 kg)  Height: 5' 9.5" (1.765 m)  Body mass index is 22.15 kg/m.        Physical Examination:   General appearance - well appearing, and in no distress  Mental status - alert, oriented to person, place, and time  Psych:  She has a normal mood and affect  Skin - warm and dry, normal color, no suspicious lesions noted  Chest - effort normal, all lung fields clear to auscultation bilaterally  Heart - normal rate and regular rhythm  Neck:  midline trachea, no thyromegaly or nodules  Breasts - breasts appear normal, no suspicious masses, no skin or nipple changes or  axillary nodes  Abdomen - soft, nontender, nondistended, no  masses or organomegaly  Pelvic - VULVA: normal appearing vulva with no masses, tenderness or lesions    VAGINA: normal appearing vagina with normal color and discharge, no lesions    CERVIX: normal appearing cervix without discharge or lesions, no CMT, large polyp protruding from cervical os  Thin prep pap is done with HR HPV cotesting  UTERUS: uterus is felt to be normal size, shape, consistency and nontender   ADNEXA: No adnexal masses or tenderness noted.  Rectal - normal rectal, good sphincter tone, no masses felt.   Extremities:  No swelling or varicosities noted  Chaperone present for exam Assessment & Plan:  1. Well woman exam with routine gynecological exam (Primary) - Pap smear and HR HPV obtained today - Mammogram scheduled for later this month - Colonoscopy scheduled 05/2024 - lab work done with PCP, Dr. Delton See.  Reviewed.  Lab work done 10/2023. - vaccines reviewed/updated  2. Cervical cancer screening - Cytology - PAP( [facility])  3. Family history of thyroid cancer  4. Need for Tdap vaccination - Tdap vaccine greater than or equal to 7yo IM  5. Cervical polyp - removal recommended.  Pt does not want to have this done today.  Will return for u/s with removal. - US PELVIS TRANSVAGINAL NON-OB (TV ONLY); Future    Orders Placed This Encounter  Procedures   US PELVIS TRANSVAGINAL NON-OB (TV  ONLY)   Tdap vaccine greater than or equal to 7yo IM    Meds: No orders of the defined types were placed in this encounter.   Follow-up: Return for Follow-up for ultrasound and possible polyp removal.  Jerene Bears, MD 03/06/2024 9:27 PM

## 2024-03-09 LAB — CYTOLOGY - PAP
Comment: NEGATIVE
Diagnosis: NEGATIVE
High risk HPV: NEGATIVE

## 2024-03-10 ENCOUNTER — Encounter (HOSPITAL_BASED_OUTPATIENT_CLINIC_OR_DEPARTMENT_OTHER): Payer: Self-pay | Admitting: Obstetrics & Gynecology

## 2024-03-11 ENCOUNTER — Other Ambulatory Visit (HOSPITAL_BASED_OUTPATIENT_CLINIC_OR_DEPARTMENT_OTHER)

## 2024-03-11 ENCOUNTER — Other Ambulatory Visit (HOSPITAL_COMMUNITY)
Admission: RE | Admit: 2024-03-11 | Discharge: 2024-03-11 | Disposition: A | Discharge status: Home / Self Care | Source: Ambulatory Visit | Attending: Obstetrics & Gynecology | Admitting: Obstetrics & Gynecology

## 2024-03-11 ENCOUNTER — Ambulatory Visit (HOSPITAL_BASED_OUTPATIENT_CLINIC_OR_DEPARTMENT_OTHER): Admitting: Obstetrics & Gynecology

## 2024-03-11 VITALS — BP 128/78 | HR 78 | Ht 69.5 in | Wt 152.0 lb

## 2024-03-11 DIAGNOSIS — N841 Polyp of cervix uteri: Secondary | ICD-10-CM | POA: Diagnosis present

## 2024-03-11 NOTE — Progress Notes (Unsigned)
 p

## 2024-03-12 LAB — SURGICAL PATHOLOGY

## 2024-08-24 ENCOUNTER — Ambulatory Visit (HOSPITAL_BASED_OUTPATIENT_CLINIC_OR_DEPARTMENT_OTHER): Admitting: Obstetrics & Gynecology

## 2024-08-24 ENCOUNTER — Encounter (HOSPITAL_BASED_OUTPATIENT_CLINIC_OR_DEPARTMENT_OTHER): Payer: Self-pay | Admitting: Obstetrics & Gynecology

## 2024-08-24 VITALS — BP 118/65 | HR 62 | Ht 69.5 in | Wt 164.0 lb

## 2024-08-24 DIAGNOSIS — R1031 Right lower quadrant pain: Secondary | ICD-10-CM | POA: Diagnosis not present

## 2024-08-24 DIAGNOSIS — K5909 Other constipation: Secondary | ICD-10-CM

## 2024-08-24 NOTE — Progress Notes (Signed)
 GYNECOLOGY  VISIT  CC:   RLQ pain  HPI: 53 y.o. G81P0011 Married White or Caucasian female here for lower right sided pain that started about 4 weeks ago.  Symptoms began on 8/21 and she had a temp of 101.  Pain more generalized on the right side.  The pain became more focal and is in the RLQ now.  She didn't have a cycle in August but then it started in early September.  Flow lasted about 7 days.  More days were spotting than actual flow.  Having some nausea.  Has taken tramadol  and ibuprofen and this helps.  It isn't unbearable but it annoying.  Having constipation but this is chronic for her.  No new urinary issues.  Not having hip pain.    Last pap was 03/2024.  MMG up to date.    Past Medical History:  Diagnosis Date   Endocervical polyp 4/07   benign   Migraine    without aura    MEDS:   Current Outpatient Medications on File Prior to Visit  Medication Sig Dispense Refill   Calcium-Magnesium-Vitamin D  (CALCIUM 500 PO) Take by mouth daily.     Cholecalciferol 25 MCG (1000 UT) capsule Take 1,000 Units by mouth daily.      Cyanocobalamin 3000 MCG SUBL Place 3,000 mcg under the tongue daily with breakfast.     doxycycline (ADOXA) 50 MG tablet Take 50 mg by mouth 2 (two) times daily.     Ferrous Sulfate Dried (FEOSOL) 200 (65 Fe) MG TABS      omeprazole (PRILOSEC OTC) 20 MG tablet Take 20 mg by mouth daily.     No current facility-administered medications on file prior to visit.    ALLERGIES: Clarithromycin  SH:  married, non smoker  Review of Systems  Constitutional: Negative.   Genitourinary:        RLQ pain    PHYSICAL EXAMINATION:    BP 118/65 (BP Location: Left Arm, Patient Position: Sitting, Cuff Size: Normal)   Pulse 62   Ht 5' 9.5 (1.765 m)   Wt 164 lb (74.4 kg)   LMP 08/03/2024 (Exact Date)   SpO2 100%   BMI 23.87 kg/m     General appearance: alert, cooperative and appears stated age CV:  Regular rate and rhythm Lungs:  clear to auscultation, no wheezes,  rales or rhonchi, symmetric air entry Abdomen: soft, mild RLQ tenderness; bowel sounds normal; no masses,  no organomegaly Lymph:  no inguinal LAD noted  Pelvic: External genitalia:  no lesions              Urethra:  normal appearing urethra with no masses, tenderness or lesions              Bartholins and Skenes: normal                 Vagina: normal mucosa without prolapse or lesions              Cervix: no lesions              Bimanual Exam:  Uterus:  normal size, contour, position, consistency, mobility, non-tender              Adnexa: no mass, fullness, tenderness              Pelvic floor tenderness in the RLQ  Chaperon e CMA, was present for exam.  Assessment/Plan: 1. RLQ abdominal pain (Primary) - she is going to return for pelvic ultrasound.  If that is negative, will refer for pelvic PT - US  PELVIS TRANSVAGINAL NON-OB (TV ONLY); Future  2. Chronic constipation - using Miralax

## 2024-08-26 ENCOUNTER — Ambulatory Visit (HOSPITAL_BASED_OUTPATIENT_CLINIC_OR_DEPARTMENT_OTHER)

## 2024-08-26 ENCOUNTER — Ambulatory Visit (INDEPENDENT_AMBULATORY_CARE_PROVIDER_SITE_OTHER)

## 2024-08-26 DIAGNOSIS — R1031 Right lower quadrant pain: Secondary | ICD-10-CM

## 2024-08-26 DIAGNOSIS — R102 Pelvic and perineal pain: Secondary | ICD-10-CM | POA: Diagnosis not present

## 2024-09-08 ENCOUNTER — Ambulatory Visit (HOSPITAL_BASED_OUTPATIENT_CLINIC_OR_DEPARTMENT_OTHER): Payer: Self-pay | Admitting: Obstetrics & Gynecology

## 2025-03-08 ENCOUNTER — Ambulatory Visit (HOSPITAL_BASED_OUTPATIENT_CLINIC_OR_DEPARTMENT_OTHER): Admitting: Obstetrics & Gynecology
